# Patient Record
Sex: Female | Born: 1985 | Race: Black or African American | Hispanic: No | Marital: Single | Smoking: Former smoker
Health system: Southern US, Community
[De-identification: ages and names within clinical notes are randomized; demographics above are authoritative.]

## PROBLEM LIST (undated history)

## (undated) DIAGNOSIS — R87620 Atypical squamous cells of undetermined significance on cytologic smear of vagina (ASC-US): Secondary | ICD-10-CM

## (undated) DIAGNOSIS — Z789 Other specified health status: Secondary | ICD-10-CM

## (undated) HISTORY — DX: Atypical squamous cells of undetermined significance on cytologic smear of vagina (ASC-US): R87.620

---

## 2007-07-21 ENCOUNTER — Inpatient Hospital Stay: Payer: Self-pay

## 2010-01-11 ENCOUNTER — Ambulatory Visit: Payer: Self-pay | Admitting: Family Medicine

## 2010-08-23 ENCOUNTER — Inpatient Hospital Stay: Payer: Self-pay | Admitting: Obstetrics and Gynecology

## 2010-12-11 ENCOUNTER — Emergency Department: Payer: Self-pay | Admitting: Unknown Physician Specialty

## 2014-10-13 ENCOUNTER — Emergency Department: Payer: Self-pay | Admitting: Emergency Medicine

## 2014-10-14 LAB — URINALYSIS, COMPLETE
BACTERIA: NONE SEEN
BILIRUBIN, UR: NEGATIVE
Blood: NEGATIVE
Glucose,UR: NEGATIVE mg/dL (ref 0–75)
LEUKOCYTE ESTERASE: NEGATIVE
Nitrite: NEGATIVE
Ph: 5 (ref 4.5–8.0)
Protein: NEGATIVE
RBC,UR: 1 /HPF (ref 0–5)
Specific Gravity: 1.027 (ref 1.003–1.030)
WBC UR: 4 /HPF (ref 0–5)

## 2015-03-11 ENCOUNTER — Inpatient Hospital Stay
Admit: 2015-03-11 | Disposition: A | Discharge status: Home / Self Care | Payer: Self-pay | Attending: Obstetrics and Gynecology | Admitting: Obstetrics and Gynecology

## 2015-03-12 LAB — CBC WITH DIFFERENTIAL/PLATELET
Basophil #: 0 10*3/uL (ref 0.0–0.1)
Basophil %: 0.2 %
EOS ABS: 0 10*3/uL (ref 0.0–0.7)
Eosinophil %: 0.2 %
HCT: 33.3 % — AB (ref 35.0–47.0)
HGB: 11.2 g/dL — ABNORMAL LOW (ref 12.0–16.0)
Lymphocyte #: 2 10*3/uL (ref 1.0–3.6)
Lymphocyte %: 20.5 %
MCH: 30.4 pg (ref 26.0–34.0)
MCHC: 33.6 g/dL (ref 32.0–36.0)
MCV: 90 fL (ref 80–100)
Monocyte #: 0.6 x10 3/mm (ref 0.2–0.9)
Monocyte %: 6.4 %
NEUTROS ABS: 7 10*3/uL — AB (ref 1.4–6.5)
Neutrophil %: 72.7 %
Platelet: 275 10*3/uL (ref 150–440)
RBC: 3.69 10*6/uL — ABNORMAL LOW (ref 3.80–5.20)
RDW: 14 % (ref 11.5–14.5)
WBC: 9.6 10*3/uL (ref 3.6–11.0)

## 2015-03-13 LAB — HEMATOCRIT: HCT: 33.9 % — AB (ref 35.0–47.0)

## 2015-03-30 NOTE — H&P (Signed)
L&D Evaluation:  History Expanded:  HPI 29 yo G3P2002 at 441w6d gestational age presents with regular uterine contractions.  She notes positive fetal movement, no leakage of fluid, no vaginal bleeding.  Patient initially received care at [facility] and transferred at about 19 weeks.  Pregnancy uncomplicated.   Gravida 3   Term 2   PreTerm 0   Abortion 0   Living 2   Blood Type (Maternal) B positive   Group B Strep Results Maternal (Result >5wks must be treated as unknown) negative   Maternal HIV Negative   Maternal Syphilis Ab Nonreactive   Maternal Varicella Non-Immune   Rubella Results (Maternal) immune   EDC 13-Mar-2015   Patient's Medical History No Chronic Illness   Patient's Surgical History none   Medications Pre Natal Vitamins   Allergies NKDA   Social History history of tobacco use   Family History Non-Contributory   ROS:  ROS All systems were reviewed.  HEENT, CNS, GI, GU, Respiratory, CV, Renal and Musculoskeletal systems were found to be normal., unless noted in HPI   Exam:  Vital Signs AFVSS   General no apparent distress   Mental Status clear   Chest clear   Heart normal sinus rhythm   Abdomen gravid, tender with contractions   Estimated Fetal Weight 7.5 pounds   Fetal Position ceph   Back no CVAT   Edema no edema   Pelvic 7cm per RN   Mebranes Intact   FHT normal rate with no decels   FHT Description 145/mod var/+accels/no decels   Ucx regular   Skin no lesions   Impression:  Impression 1) Intrauterine pregnancy at [redacted]w[redacted]d gestational age, 2) active labor   Plan:  Comments 1) Labor: expectant management 2) Fetus - category I tracing 3) PNL B positive / ABSC negative / RI / VZNI -vaccinate pp / HIV neg / RPR NR / HBsAg neg / 1-hr OGTT 95 / GBS negative /  4) TDAP 01/08/15, flu  declined 5) Disposition - home postpartum   Labs:  Lab Results: Routine Hem:  22-Apr-16 00:15   WBC (CBC) 9.6  Hematocrit (CBC)  33.3   Platelet Count (CBC) 275   Electronic Signatures: Conard NovakJackson, Mohsin Crum D (MD)  (Signed 22-Apr-16 02:26)  Authored: L&D Evaluation, Labs   Last Updated: 22-Apr-16 02:26 by Conard NovakJackson, Jasson Siegmann D (MD)

## 2015-07-15 ENCOUNTER — Emergency Department
Admission: EM | Admit: 2015-07-15 | Discharge: 2015-07-15 | Disposition: A | Discharge status: Home / Self Care | Payer: No Typology Code available for payment source | Attending: Emergency Medicine | Admitting: Emergency Medicine

## 2015-07-15 ENCOUNTER — Encounter: Payer: Self-pay | Admitting: Emergency Medicine

## 2015-07-15 DIAGNOSIS — Y9389 Activity, other specified: Secondary | ICD-10-CM | POA: Diagnosis not present

## 2015-07-15 DIAGNOSIS — M25511 Pain in right shoulder: Secondary | ICD-10-CM

## 2015-07-15 DIAGNOSIS — Z3A00 Weeks of gestation of pregnancy not specified: Secondary | ICD-10-CM | POA: Insufficient documentation

## 2015-07-15 DIAGNOSIS — S4991XA Unspecified injury of right shoulder and upper arm, initial encounter: Secondary | ICD-10-CM | POA: Insufficient documentation

## 2015-07-15 DIAGNOSIS — Y998 Other external cause status: Secondary | ICD-10-CM | POA: Diagnosis not present

## 2015-07-15 DIAGNOSIS — Z3490 Encounter for supervision of normal pregnancy, unspecified, unspecified trimester: Secondary | ICD-10-CM

## 2015-07-15 DIAGNOSIS — O9A219 Injury, poisoning and certain other consequences of external causes complicating pregnancy, unspecified trimester: Secondary | ICD-10-CM | POA: Insufficient documentation

## 2015-07-15 DIAGNOSIS — O99330 Smoking (tobacco) complicating pregnancy, unspecified trimester: Secondary | ICD-10-CM | POA: Insufficient documentation

## 2015-07-15 DIAGNOSIS — F17200 Nicotine dependence, unspecified, uncomplicated: Secondary | ICD-10-CM | POA: Diagnosis not present

## 2015-07-15 DIAGNOSIS — Y92410 Unspecified street and highway as the place of occurrence of the external cause: Secondary | ICD-10-CM | POA: Insufficient documentation

## 2015-07-15 LAB — POCT PREGNANCY, URINE: Preg Test, Ur: POSITIVE — AB
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
## 2015-10-04 ENCOUNTER — Other Ambulatory Visit: Payer: Self-pay | Admitting: Obstetrics and Gynecology

## 2015-10-04 DIAGNOSIS — G930 Cerebral cysts: Secondary | ICD-10-CM

## 2015-10-21 ENCOUNTER — Ambulatory Visit
Admission: RE | Admit: 2015-10-21 | Discharge: 2015-10-21 | Disposition: A | Discharge status: Home / Self Care | Payer: No Typology Code available for payment source | Source: Ambulatory Visit | Attending: Obstetrics and Gynecology | Admitting: Obstetrics and Gynecology

## 2015-10-21 ENCOUNTER — Ambulatory Visit (HOSPITAL_BASED_OUTPATIENT_CLINIC_OR_DEPARTMENT_OTHER)
Admission: RE | Admit: 2015-10-21 | Discharge: 2015-10-21 | Disposition: A | Discharge status: Home / Self Care | Payer: No Typology Code available for payment source | Source: Ambulatory Visit | Attending: Obstetrics and Gynecology | Admitting: Obstetrics and Gynecology

## 2015-10-21 DIAGNOSIS — O358XX0 Maternal care for other (suspected) fetal abnormality and damage, not applicable or unspecified: Secondary | ICD-10-CM | POA: Diagnosis not present

## 2015-10-21 DIAGNOSIS — G930 Cerebral cysts: Secondary | ICD-10-CM

## 2015-10-21 DIAGNOSIS — O350XX0 Maternal care for (suspected) central nervous system malformation in fetus, not applicable or unspecified: Secondary | ICD-10-CM | POA: Insufficient documentation

## 2015-10-21 DIAGNOSIS — O3503X0 Maternal care for (suspected) central nervous system malformation or damage in fetus, choroid plexus cysts, not applicable or unspecified: Secondary | ICD-10-CM | POA: Insufficient documentation

## 2015-10-21 NOTE — Progress Notes (Signed)
Deborah Wells, MS, CGC performed an integral service incident to the physician's initial service.  I was physically present in the clinical area and was immediately available to render assistance.   Furman Trentman C Kalev Temme  

## 2015-10-21 NOTE — Progress Notes (Signed)
Joann Clark Length of Consultation: 30 minute consultation  Joann Clark was referred to [facility] for ultrasound and genetic counseling after a choroid plexus cyst was noted on a routine ultrasound.   A thorough evaluation of the fetal anatomy was performed in our clinic, and within the limits of the procedure, all fetal anatomy appeared normal.  The choroid plexus cysts were not seen today.  The choroid plexus is an area in the brain where cerebral spinal fluid, the fluid that bathes the brain and spinal cord, is made. Cysts, or fluid filled sacs, are sometimes found in the choroid plexus of babies both before and after they are born. These cysts are referred to as choroid plexus cysts (CPC).  Some literature suggests that as many as 1 in 50 fetuses (2%) evaluated by ultrasound will show these cysts. The significance of these cysts remains unclear, although it is believed that most are a normal variation of development. Many studies have been done to see if choroid plexus cysts seen on prenatal ultrasound increase the concern for other problems in the baby.  After evaluating these studies, it appears that there may be an increase in the risk for a chromosome condition called trisomy 49.  However, this increase in risk is more significant if other ultrasound markers of chromosome conditions are seen, or if a birth defect is identified by ultrasound. The increase in risk is less significant if the choroid plexus cysts are an "isolated" finding at ultrasound.  In the absence of a chromosome condition, choroid plexus cysts are not expected to be harmful to the fetus.    Chromosomes are the inherited structures that contain our genes (traits).  Each cell of our body normally has 46 chromosomes, matched up in 23 pairs.  The last pair determines our gender and are called the sex chromosomes.  A female has an X and a Y chromosome, while a female has two X chromosomes.  Rarely, when a  mother's egg and father's sperm unite, an extra or missing chromosome can be passed on to the baby by mistake.  We discussed examples of such a problem including: Down syndrome (an extra 21) and Trisomy 18 syndrome (an extra 18), both involving some degree of mental retardation and physical problems, with the later being much more severe.  The finding of isolated choroid plexus cysts in this pregnancy may increase the chance for trisomy 18 slightly, but it is expected that the risk is still less than the 1 in 200 chance for complications from amniocentesis.  The most likely outcome is that the baby is in good health.  We discussed the following prenatal testing options for this pregnancy:  Maternal serum marker screening is a maternal blood test which measures up to four pregnancy proteins.  Currently, this screening test can detect approximately 80% of cases of Down syndrome, 60% of Trisomy 18 and >80% of neural tube defects (e.g. spina bifida).  Because it does not directly examine the fetal chromosomes or fetus itself, it cannot positively diagnose or rule out these problems. This was previously ordered through [facility] and was normal.  Targeted ultrasound uses high frequency sound waves to create an image of the developing fetus.  An ultrasound is often recommended as a routine means of evaluating the pregnancy.  It is also used to screen for fetal anatomy problems (for example, a heart defect) that might be suggestive of a chromosomal or other abnormality.   Amniocentesis involves the removal of  a small amount of amniotic fluid from the sac surrounding the fetus with the use of a thin needle inserted through the maternal abdomen and uterus.  Ultrasound guidance is used throughout the procedure.  Fetal cells from amniotic fluid are directly evaluated and > 99.5% of chromosome problems and > 98% of open neural tube defects can be detected. This procedure is generally performed after the 15th week of  pregnancy.  The main risks to this procedure include complications leading to miscarriage in less than 1 in 200 cases (0.5%).  We also reviewed the availability of cell free fetal DNA testing from maternal blood to determine whether or not the baby may have Down syndrome, trisomy 6613, or trisomy 4118.  This test utilizes a maternal blood sample and DNA sequencing technology to isolate circulating cell free fetal DNA from maternal plasma.  The fetal DNA can then be analyzed for DNA sequences that are derived from the three most common chromosomes involved in aneuploidy, chromosomes 13, 18, and 21.  If the overall amount of DNA is greater than the expected level for any of these chromosomes, aneuploidy is suspected.  While we do not consider it a replacement for invasive testing and karyotype analysis, a negative result from this testing would be reassuring, though not a guarantee of a normal chromosome complement for the baby.  An abnormal result is certainly suggestive of an abnormal chromosome complement, though we would still recommend CVS or amniocentesis to confirm any findings from this testing.  This testing was also performed previously and was normal, showing no indication of trisomy 13, 18 or 21.  The sex chromosome analysis was also normal, though the patient does not wish to know the gender of the baby.  A detailed pregnancy and family history was obtained.  Joann Clark reported that this is her fourth pregnancy, the second with her current partner.  She has three healthy daughters.  In the family history, she stated that she has two brothers with bipolar disorder and one maternal great uncle also with mental health concerns.  She denied a personal history of mental health conditions. We discussed that the genetic factors involved in the development of mental health conditions are not well understood, though in some families there appears to be a strong genetic component.  We encouraged her to be aware of  this history and possible increased risks for herself and her children, but that no speicfic testing is available.  She also reported a first cousin once removed with Down syndrome.  Down syndrome is caused by an extra copy of the genetic instructions located on chromosome number 21.  These extra instructions result in the characteristic facial appearance, mental retardation, and an increased risk for some types of birth defects.  The majority (95%) of persons with Down syndrome have three freestanding copies of chromosome number 21, called trisomy 9521.  This type of Down syndrome occurs as a sporadic condition and does not increase the chance for other family members to have Down syndrome.  Rarely, Down syndrome is caused by a rearrangement of the genetic instructions, where the extra chromosome number 21 is attached to another chromosome.  This type of chromosome rearrangement can be passed down through families and therefore may increase the chance for Down syndrome in other family members.  We cannot determine which type of Down syndrome is present without documentation of chromosome studies in the affected family member.  If any additional information is obtained about this history, please do not hesitate  to contact us.  The screening performed in this pregnancy showed no increased risk for Down syndrome.  After consideration of the options, the patient elected to have an ultrasound only today.  She declines amniocentesis given the normal ultrasound today as well as normal results of her maternal serum screening and InformaSeq testing.  Thank you for involving [place] in the care of this patient.  Joann Clark was encouraged to call with questions or concerns. We can be reached at [phone].  Cherly Anderson, MS, CGC

## 2015-11-21 NOTE — L&D Delivery Note (Signed)
Delivery Note At 5:29 PM a viable female was delivered via  (Presentation: OA/ROA/OP).  APGAR: 8, 9; weight: 3090g  .   Placenta status: Intact, Spontaneous.  Cord:  with the following complications: None.  Cord pH: NA  Pt came from ER with urge to push. 8.5cm/90/-2. Baby with decelerations corresponding with/just after contractions down to 80's/90's with return over 30 seconds to baseline 140's. Attempt to reduce lip but pushes were ineffective. AROM for clear with some fetal descent to -1. FHR improved following AROM and had pt breathe through a couple of ctxs. Increased pressure and better focus so mom pushed to delivery in 2 ctxs viable female infant.  The head followed by shoulders, which delivered without difficulty, and the rest of the body.  Tight nuchal cord noted reduced following birth of baby.  Baby to mom's chest.  Cord clamped and cut after > 1 min delay.  No cord blood obtained.  Placenta delivered spontaneously, intact, with a 3-vessel cord. Additional membranes pulled out with ring forceps. Perineum intact.  All counts correct.  Hemostasis obtained with IV pitocin and fundal massage. EBL 150 mL.    Anesthesia: None  Episiotomy: none Lacerations: none  Suture Repair: NA Est. Blood Loss (mL): 150  Mom to postpartum.  Baby to Couplet care / Skin to Skin.  Tresea MallGLEDHILL,Cristy Colmenares, CNM  This patient and plan were discussed with Dr Jean RosenthalJackson 02/23/2016

## 2016-02-23 ENCOUNTER — Inpatient Hospital Stay
Admission: EM | Admit: 2016-02-23 | Discharge: 2016-02-24 | DRG: 775 | Disposition: A | Discharge status: Home / Self Care | Payer: Medicaid Other | Attending: Obstetrics and Gynecology | Admitting: Obstetrics and Gynecology

## 2016-02-23 ENCOUNTER — Encounter: Payer: Self-pay | Admitting: *Deleted

## 2016-02-23 DIAGNOSIS — Z87891 Personal history of nicotine dependence: Secondary | ICD-10-CM

## 2016-02-23 DIAGNOSIS — Z3A40 40 weeks gestation of pregnancy: Secondary | ICD-10-CM

## 2016-02-23 DIAGNOSIS — Z8249 Family history of ischemic heart disease and other diseases of the circulatory system: Secondary | ICD-10-CM

## 2016-02-23 HISTORY — DX: Other specified health status: Z78.9

## 2016-02-23 LAB — TYPE AND SCREEN
ABO/RH(D): B POS
ANTIBODY SCREEN: NEGATIVE

## 2016-02-23 LAB — ABO/RH: ABO/RH(D): B POS

## 2016-02-23 MED ORDER — LACTATED RINGERS IV SOLN: 500.0000 mL | INTRAVENOUS | Status: DC | PRN

## 2016-02-23 MED ORDER — LACTATED RINGERS IV SOLN
INTRAVENOUS | Status: DC
  Administered 2016-02-23: 17:00:00 via INTRAVENOUS

## 2016-02-23 MED ORDER — ACETAMINOPHEN 325 MG PO TABS: 650.0000 mg | ORAL_TABLET | ORAL | Status: DC | PRN

## 2016-02-23 MED ORDER — TETANUS-DIPHTH-ACELL PERTUSSIS 5-2.5-18.5 LF-MCG/0.5 IM SUSP: 0.5000 mL | Freq: Once | INTRAMUSCULAR | Status: DC

## 2016-02-23 MED ORDER — AMMONIA AROMATIC IN INHA
RESPIRATORY_TRACT | Status: AC
  Filled 2016-02-23: qty 10

## 2016-02-23 MED ORDER — OXYTOCIN BOLUS FROM INFUSION: 500.0000 mL | INTRAVENOUS | Status: DC

## 2016-02-23 MED ORDER — DIPHENHYDRAMINE HCL 25 MG PO CAPS: 25.0000 mg | ORAL_CAPSULE | Freq: Four times a day (QID) | ORAL | Status: DC | PRN

## 2016-02-23 MED ORDER — IBUPROFEN 600 MG PO TABS
600.0000 mg | ORAL_TABLET | Freq: Four times a day (QID) | ORAL | Status: DC
  Administered 2016-02-24 (×3): 600 mg via ORAL
  Filled 2016-02-23 (×3): qty 1

## 2016-02-23 MED ORDER — OXYCODONE-ACETAMINOPHEN 5-325 MG PO TABS
2.0000 | ORAL_TABLET | ORAL | Status: DC | PRN
  Administered 2016-02-23: 1 via ORAL
  Filled 2016-02-23: qty 1

## 2016-02-23 MED ORDER — SENNOSIDES-DOCUSATE SODIUM 8.6-50 MG PO TABS: 2.0000 | ORAL_TABLET | ORAL | Status: DC

## 2016-02-23 MED ORDER — OXYCODONE HCL 5 MG PO TABS
10.0000 mg | ORAL_TABLET | ORAL | Status: DC | PRN
  Administered 2016-02-23: 10 mg via ORAL
  Filled 2016-02-23: qty 2

## 2016-02-23 MED ORDER — LANOLIN HYDROUS EX OINT: TOPICAL_OINTMENT | CUTANEOUS | Status: DC | PRN

## 2016-02-23 MED ORDER — BENZOCAINE-MENTHOL 20-0.5 % EX AERO
1.0000 "application " | INHALATION_SPRAY | CUTANEOUS | Status: DC | PRN
  Administered 2016-02-23: 1 via TOPICAL
  Filled 2016-02-23: qty 56

## 2016-02-23 MED ORDER — OXYTOCIN 10 UNIT/ML IJ SOLN
INTRAMUSCULAR | Status: AC
  Filled 2016-02-23: qty 2

## 2016-02-23 MED ORDER — OXYCODONE-ACETAMINOPHEN 5-325 MG PO TABS: 1.0000 | ORAL_TABLET | ORAL | Status: DC | PRN

## 2016-02-23 MED ORDER — OXYTOCIN 40 UNITS IN LACTATED RINGERS INFUSION - SIMPLE MED
2.5000 [IU]/h | INTRAVENOUS | Status: DC
  Administered 2016-02-23: 39.96 [IU]/h via INTRAVENOUS
  Filled 2016-02-23: qty 1000

## 2016-02-23 MED ORDER — WITCH HAZEL-GLYCERIN EX PADS: 1.0000 "application " | MEDICATED_PAD | CUTANEOUS | Status: DC | PRN

## 2016-02-23 MED ORDER — IBUPROFEN 600 MG PO TABS
600.0000 mg | ORAL_TABLET | Freq: Four times a day (QID) | ORAL | Status: DC
  Administered 2016-02-23: 600 mg via ORAL
  Filled 2016-02-23: qty 1

## 2016-02-23 MED ORDER — ONDANSETRON HCL 4 MG/2ML IJ SOLN: 4.0000 mg | Freq: Four times a day (QID) | INTRAMUSCULAR | Status: DC | PRN

## 2016-02-23 MED ORDER — LIDOCAINE HCL (PF) 1 % IJ SOLN: 30.0000 mL | INTRAMUSCULAR | Status: DC | PRN

## 2016-02-23 MED ORDER — LIDOCAINE HCL (PF) 1 % IJ SOLN
INTRAMUSCULAR | Status: AC
  Filled 2016-02-23: qty 30

## 2016-02-23 MED ORDER — SIMETHICONE 80 MG PO CHEW: 80.0000 mg | CHEWABLE_TABLET | ORAL | Status: DC | PRN

## 2016-02-23 MED ORDER — MISOPROSTOL 200 MCG PO TABS
ORAL_TABLET | ORAL | Status: AC
  Filled 2016-02-23: qty 4

## 2016-02-23 MED ORDER — BUTORPHANOL TARTRATE 1 MG/ML IJ SOLN: 1.0000 mg | INTRAMUSCULAR | Status: DC | PRN

## 2016-02-23 MED ORDER — OXYCODONE HCL 5 MG PO TABS: 5.0000 mg | ORAL_TABLET | ORAL | Status: DC | PRN

## 2016-02-23 MED ORDER — PRENATAL MULTIVITAMIN CH
1.0000 | ORAL_TABLET | Freq: Every day | ORAL | Status: DC
  Administered 2016-02-24: 1 via ORAL
  Filled 2016-02-23: qty 1

## 2016-02-23 MED ORDER — ONDANSETRON HCL 4 MG PO TABS: 4.0000 mg | ORAL_TABLET | ORAL | Status: DC | PRN

## 2016-02-23 MED ORDER — DIBUCAINE 1 % RE OINT: 1.0000 "application " | TOPICAL_OINTMENT | RECTAL | Status: DC | PRN

## 2016-02-23 MED ORDER — ONDANSETRON HCL 4 MG/2ML IJ SOLN: 4.0000 mg | INTRAMUSCULAR | Status: DC | PRN

## 2016-02-23 NOTE — H&P (Signed)
OB History & Physical   History of Present Illness:  Chief Complaint: Contractions, urge to push  HPI:  Joann Clark is a 30 y.o. 634P2003 female at 218w2d dated by LMP.  Her pregnancy has been uncomplicated.    She reports contractions.   She denies leakage of fluid.   She denies vaginal bleeding.   She reports fetal movement.    Maternal Medical History:   Past Medical History  Diagnosis Date  . Medical history non-contributory     History reviewed. No pertinent past surgical history.  No Known Allergies  Prior to Admission medications   Not on File    OB History  Gravida Para Term Preterm AB SAB TAB Ectopic Multiple Living  4 2 2       3     # Outcome Date GA Lbr Len/2nd Weight Sex Delivery Anes PTL Lv  4 Current           3 Gravida 03/21/15    F Vag-Spont   Y  2 Term 11/20/10    F Vag-Spont   Y  1 Term 11/25/03    F Jarrett AblesVag-Spont  N Y      Prenatal care site: [facility]  Social History: She  reports that she has quit smoking. She has never used smokeless tobacco. She reports that she does not drink alcohol or use illicit drugs.  Family History: family history includes Hypertension in her mother.   Review of Systems: Negative x 10 systems reviewed except as noted in the HPI.    Physical Exam:  Vital Signs: LMP 05/17/2015 (Exact Date) General: no acute distress.  HEENT: normocephalic, atraumatic Heart: regular rate & rhythm.  No murmurs/rubs/gallops Lungs: clear to auscultation bilaterally Abdomen: soft, gravid, non-tender;  EFW: 6 1/2 pounds Pelvic:   External: Normal external female genitalia  Cervix:  8 /  90 /  -2    Extremities: non-tender, symmetric, no edema bilaterally.  DTRs: 2+ bilaterally  Neurologic: Alert & oriented x 3.    Pertinent Results:  Prenatal Labs: Blood type/Rh B positive  Antibody screen negative  Rubella Immune  Varicella Not immune    RPR negative  HBsAg negative  HIV negative  GC negative  Chlamydia negative  Genetic  screening negative  1 hour GTT 88  3 hour GTT NA  GBS negative on 3/20   Baseline FHR: 145 beats/min   Variability: moderate   Accelerations: present   Decelerations: present to 80's/90's just after contraction Contractions: present frequency: every 2-3 minutes Overall assessment: Cat II    Assessment:  Joann Clark is a 30 y.o. 694P2003 female at 8518w2d with active labor  Plan:  1. Admit to Labor & Delivery  2. CBC, T&S, Clrs, IVF 3. GBS negative.   4. Fetal well-being: Cat II 5. AROM/Reduce anterior lip for delivery  Giavanni Odonovan, CNM  This patient and plan were discussed with Dr Jean RosenthalJackson 02/23/2016

## 2016-02-23 NOTE — Discharge Summary (Signed)
OB Discharge Summary  Patient Name: Joann Clark DOB: 09/07/1986 MRN: [id number]  Date of admission: 02/23/2016 Delivering MD: Tresea Mall, CNM Date of Delivery: 02/23/2016  Date of discharge: 02/24/16 Admitting diagnosis: 40 wks contractions, active labor Intrauterine pregnancy: [redacted]w[redacted]d     Secondary diagnosis: None     Discharge diagnosis: Term Pregnancy Delivered                                                                                                Post partum procedures:none  Augmentation: AROM  Complications: None  Hospital course:  Onset of Labor With Vaginal Delivery     30 y.o. yo G4P2003 at [redacted]w[redacted]d was admitted in Active Labor on 02/23/2016. Patient had an uncomplicated labor course as follows:  Membrane Rupture Time/Date: 5:09 PM ,02/23/2016   Intrapartum Procedures: Episiotomy:   none                                        Lacerations:    none Patient had a delivery of a Viable infant. 02/23/2016  Information for the patient's newborn:  Sarely, Stracener Girl Tanika [[id number]]       Pateint had an uncomplicated postpartum course.  She is ambulating, tolerating a regular diet, passing flatus, and urinating well. Patient is discharged home in stable condition on 02/24/16.    Physical exam  Filed Vitals:   02/24/16 0434 02/24/16 0700  BP: 104/53 97/58  Pulse: 61 62  Temp: 98.6 F (37 C) 98 F (36.7 C)  Resp: 18 20   General: alert, cooperative and no distress Lochia: appropriate Uterine Fundus: firm Incision: N/A DVT Evaluation: No evidence of DVT seen on physical exam.  Labs:  Postpartum HCT: 31.2 - precipitous delivery, so not pre-delivery labs obtained  Discharge instruction:  Call office if you have any of the following: headache, visual changes, fever >100 F, chills, breast concerns, excessive vaginal bleeding, incision drainage or problems, leg pain or redness, depression or any other concerns.   Activity: Do not lift > 10 lbs for 6 weeks.  No intercourse or  tampons for 6 weeks.  No driving for 1-2 weeks.     Medications:    Medication List    TAKE these medications        ibuprofen 600 MG tablet  Commonly known as:  ADVIL,MOTRIN  Take 1 tablet (600 mg total) by mouth every 6 (six) hours.         Diet: routine diet  Activity: Advance as tolerated. Pelvic rest for 6 weeks.   Outpatient follow up: Follow-up Information    Follow up with [facility], CNM. Schedule an appointment as soon as possible for a visit in 6 weeks.   Specialty:  Obstetrics   Why:  postpartum visit & nexplanon insertion   Contact information:   [address] [place] [address] [phone]       Postpartum contraception: Nexplanon Rhogam Given postpartum: NA Rubella vaccine given postpartum: yes Varicella vaccine given postpartum: pt declined TDaP given antepartum or postpartum:  antepartum   Newborn Data: Live born female  Birth Weight: 3090g APGAR: 8, 9   Baby Feeding: Bottle  Disposition:home with mother

## 2016-02-24 LAB — CBC
HCT: 31.2 % — ABNORMAL LOW (ref 35.0–47.0)
Hemoglobin: 10.8 g/dL — ABNORMAL LOW (ref 12.0–16.0)
MCH: 31 pg (ref 26.0–34.0)
MCHC: 34.5 g/dL (ref 32.0–36.0)
MCV: 89.7 fL (ref 80.0–100.0)
PLATELETS: 262 10*3/uL (ref 150–440)
RBC: 3.48 MIL/uL — ABNORMAL LOW (ref 3.80–5.20)
RDW: 14.3 % (ref 11.5–14.5)
WBC: 9.8 10*3/uL (ref 3.6–11.0)

## 2016-02-24 MED ORDER — VARICELLA VIRUS VACCINE LIVE 1350 PFU/0.5ML IJ SUSR
0.5000 mL | Freq: Once | INTRAMUSCULAR | Status: DC
  Filled 2016-02-24: qty 0.5

## 2016-02-24 MED ORDER — IBUPROFEN 600 MG PO TABS: 600.0000 mg | ORAL_TABLET | Freq: Four times a day (QID) | ORAL | Status: DC

## 2016-02-24 NOTE — Progress Notes (Signed)
Patient discharged home with infant and family. Discharge instructions, prescriptions and follow up appointment given to and reviewed with patient and family. Patient verbalized understanding.

## 2016-02-24 NOTE — Discharge Instructions (Signed)
Discharge instructions:  ° °Call office if you have any of the following: headache, visual changes, fever >100 F, chills, breast concerns, excessive vaginal bleeding, incision drainage or problems, leg pain or redness, depression or any other concerns.  ° °Activity: Do not lift > 10 lbs for 6 weeks.  °No intercourse or tampons for 6 weeks.  °No driving for 1-2 weeks.  ° °

## 2016-02-25 LAB — RPR: RPR: NONREACTIVE

## 2017-03-09 ENCOUNTER — Ambulatory Visit: Payer: Self-pay

## 2017-03-13 ENCOUNTER — Ambulatory Visit: Payer: Self-pay | Admitting: Obstetrics & Gynecology

## 2017-03-13 ENCOUNTER — Ambulatory Visit (INDEPENDENT_AMBULATORY_CARE_PROVIDER_SITE_OTHER): Payer: Medicaid Other

## 2017-03-13 ENCOUNTER — Ambulatory Visit: Payer: Self-pay

## 2017-03-13 DIAGNOSIS — Z3042 Encounter for surveillance of injectable contraceptive: Secondary | ICD-10-CM

## 2017-03-13 DIAGNOSIS — Z308 Encounter for other contraceptive management: Secondary | ICD-10-CM

## 2017-03-13 MED ORDER — MEDROXYPROGESTERONE ACETATE 150 MG/ML IM SUSP
150.0000 mg | Freq: Once | INTRAMUSCULAR | Status: AC
  Administered 2017-03-13: 150 mg via INTRAMUSCULAR

## 2017-03-13 NOTE — Progress Notes (Signed)
Pt here for depo which was given IM right glut.  NDC# [address]-1 

## 2017-03-26 ENCOUNTER — Encounter: Payer: Self-pay | Admitting: Obstetrics & Gynecology

## 2017-03-26 ENCOUNTER — Ambulatory Visit (INDEPENDENT_AMBULATORY_CARE_PROVIDER_SITE_OTHER): Payer: Medicaid Other | Admitting: Obstetrics & Gynecology

## 2017-03-26 VITALS — BP 100/60 | Ht 65.0 in | Wt 185.0 lb

## 2017-03-26 DIAGNOSIS — Z308 Encounter for other contraceptive management: Secondary | ICD-10-CM

## 2017-03-26 DIAGNOSIS — Z0000 Encounter for general adult medical examination without abnormal findings: Secondary | ICD-10-CM

## 2017-03-26 DIAGNOSIS — Z131 Encounter for screening for diabetes mellitus: Secondary | ICD-10-CM

## 2017-03-26 DIAGNOSIS — Z13220 Encounter for screening for lipoid disorders: Secondary | ICD-10-CM

## 2017-03-26 DIAGNOSIS — Z124 Encounter for screening for malignant neoplasm of cervix: Secondary | ICD-10-CM

## 2017-03-26 DIAGNOSIS — Z1321 Encounter for screening for nutritional disorder: Secondary | ICD-10-CM

## 2017-03-26 DIAGNOSIS — Z1329 Encounter for screening for other suspected endocrine disorder: Secondary | ICD-10-CM

## 2017-03-26 MED ORDER — MEDROXYPROGESTERONE ACETATE 150 MG/ML IM SUSP: INTRAMUSCULAR | 3 refills | Status: DC

## 2017-03-26 NOTE — Patient Instructions (Signed)
Health Maintenance, Female Adopting a healthy lifestyle and getting preventive care can go a long way to promote health and wellness. Talk with your health care provider about what schedule of regular examinations is right for you. This is a good chance for you to check in with your provider about disease prevention and staying healthy. In between checkups, there are plenty of things you can do on your own. Experts have done a lot of research about which lifestyle changes and preventive measures are most likely to keep you healthy. Ask your health care provider for more information. Weight and diet Eat a healthy diet  Be sure to include plenty of vegetables, fruits, low-fat dairy products, and lean protein.  Do not eat a lot of foods high in solid fats, added sugars, or salt.  Get regular exercise. This is one of the most important things you can do for your health.  Most adults should exercise for at least 150 minutes each week. The exercise should increase your heart rate and make you sweat (moderate-intensity exercise).  Most adults should also do strengthening exercises at least twice a week. This is in addition to the moderate-intensity exercise. Maintain a healthy weight  Body mass index (BMI) is a measurement that can be used to identify possible weight problems. It estimates body fat based on height and weight. Your health care provider can help determine your BMI and help you achieve or maintain a healthy weight.  For females 40 years of age and older:  A BMI below 18.5 is considered underweight.  A BMI of 18.5 to 24.9 is normal.  A BMI of 25 to 29.9 is considered overweight.  A BMI of 30 and above is considered obese. Watch levels of cholesterol and blood lipids  You should start having your blood tested for lipids and cholesterol at 31 years of age, then have this test every 5 years.  You may need to have your cholesterol levels checked more often if:  Your lipid or  cholesterol levels are high.  You are older than 31 years of age.  You are at high risk for heart disease. Cancer screening Lung Cancer  Lung cancer screening is recommended for adults 31-79 years old who are at high risk for lung cancer because of a history of smoking.  A yearly low-dose CT scan of the lungs is recommended for people who:  Currently smoke.  Have quit within the past 15 years.  Have at least a 30-pack-year history of smoking. A pack year is smoking an average of one pack of cigarettes a day for 1 year.  Yearly screening should continue until it has been 15 years since you quit.  Yearly screening should stop if you develop a health problem that would prevent you from having lung cancer treatment. Breast Cancer  Practice breast self-awareness. This means understanding how your breasts normally appear and feel.  It also means doing regular breast self-exams. Let your health care provider know about any changes, no matter how small.  If you are in your 31s or 30s, you should have a clinical breast exam (CBE) by a health care provider every 1-3 years as part of a regular health exam.  If you are 75 or older, have a CBE every year. Also consider having a breast X-ray (mammogram) every year.  If you have a family history of breast cancer, talk to your health care provider about genetic screening.  If you are at high risk for breast cancer, talk  to your health care provider about having an MRI and a mammogram every year.  Breast cancer gene (BRCA) assessment is recommended for women who have family members with BRCA-related cancers. BRCA-related cancers include:  Breast.  Ovarian.  Tubal.  Peritoneal cancers.  Results of the assessment will determine the need for genetic counseling and BRCA1 and BRCA2 testing. Cervical Cancer  Your health care provider may recommend that you be screened regularly for cancer of the pelvic organs (ovaries, uterus, and vagina).  This screening involves a pelvic examination, including checking for microscopic changes to the surface of your cervix (Pap test). You may be encouraged to have this screening done every 3 years, beginning at age 31.  For women ages 66-65, health care providers may recommend pelvic exams and Pap testing every 3 years, or they may recommend the Pap and pelvic exam, combined with testing for human papilloma virus (HPV), every 5 years. Some types of HPV increase your risk of cervical cancer. Testing for HPV may also be done on women of any age with unclear Pap test results.  Other health care providers may not recommend any screening for nonpregnant women who are considered low risk for pelvic cancer and who do not have symptoms. Ask your health care provider if a screening pelvic exam is right for you.  If you have had past treatment for cervical cancer or a condition that could lead to cancer, you need Pap tests and screening for cancer for at least 20 years after your treatment. If Pap tests have been discontinued, your risk factors (such as having a new sexual partner) need to be reassessed to determine if screening should resume. Some women have medical problems that increase the chance of getting cervical cancer. In these cases, your health care provider may recommend more frequent screening and Pap tests. Colorectal Cancer  This type of cancer can be detected and often prevented.  Routine colorectal cancer screening usually begins at 31 years of age and continues through 31 years of age.  Your health care provider may recommend screening at an earlier age if you have risk factors for colon cancer.  Your health care provider may also recommend using home test kits to check for hidden blood in the stool.  A small camera at the end of a tube can be used to examine your colon directly (sigmoidoscopy or colonoscopy). This is done to check for the earliest forms of colorectal cancer.  Routine  screening usually begins at age 41.  Direct examination of the colon should be repeated every 5-10 years through 31 years of age. However, you may need to be screened more often if early forms of precancerous polyps or small growths are found. Skin Cancer  Check your skin from head to toe regularly.  Tell your health care provider about any new moles or changes in moles, especially if there is a change in a mole's shape or color.  Also tell your health care provider if you have a mole that is larger than the size of a pencil eraser.  Always use sunscreen. Apply sunscreen liberally and repeatedly throughout the day.  Protect yourself by wearing long sleeves, pants, a wide-brimmed hat, and sunglasses whenever you are outside. Heart disease, diabetes, and high blood pressure  High blood pressure causes heart disease and increases the risk of stroke. High blood pressure is more likely to develop in:  People who have blood pressure in the high end of the normal range (130-139/85-89 mm Hg).  People who are overweight or obese.  People who are African American.  If you are 59-53 years of age, have your blood pressure checked every 3-5 years. If you are 53 years of age or older, have your blood pressure checked every year. You should have your blood pressure measured twice-once when you are at a hospital or clinic, and once when you are not at a hospital or clinic. Record the average of the two measurements. To check your blood pressure when you are not at a hospital or clinic, you can use:  An automated blood pressure machine at a pharmacy.  A home blood pressure monitor.  If you are between 17 years and 38 years old, ask your health care provider if you should take aspirin to prevent strokes.  Have regular diabetes screenings. This involves taking a blood sample to check your fasting blood sugar level.  If you are at a normal weight and have a low risk for diabetes, have this test once  every three years after 31 years of age.  If you are overweight and have a high risk for diabetes, consider being tested at a younger age or more often. Preventing infection Hepatitis B  If you have a higher risk for hepatitis B, you should be screened for this virus. You are considered at high risk for hepatitis B if:  You were born in a country where hepatitis B is common. Ask your health care provider which countries are considered high risk.  Your parents were born in a high-risk country, and you have not been immunized against hepatitis B (hepatitis B vaccine).  You have HIV or AIDS.  You use needles to inject street drugs.  You live with someone who has hepatitis B.  You have had sex with someone who has hepatitis B.  You get hemodialysis treatment.  You take certain medicines for conditions, including cancer, organ transplantation, and autoimmune conditions. Hepatitis C  Blood testing is recommended for:  Everyone born from [birth year] through 1965.  Anyone with known risk factors for hepatitis C. Sexually transmitted infections (STIs)  You should be screened for sexually transmitted infections (STIs) including gonorrhea and chlamydia if:  You are sexually active and are younger than 31 years of age.  You are older than 31 years of age and your health care provider tells you that you are at risk for this type of infection.  Your sexual activity has changed since you were last screened and you are at an increased risk for chlamydia or gonorrhea. Ask your health care provider if you are at risk.  If you do not have HIV, but are at risk, it may be recommended that you take a prescription medicine daily to prevent HIV infection. This is called pre-exposure prophylaxis (PrEP). You are considered at risk if:  You are sexually active and do not regularly use condoms or know the HIV status of your partner(s).  You take drugs by injection.  You are sexually active with a partner  who has HIV. Talk with your health care provider about whether you are at high risk of being infected with HIV. If you choose to begin PrEP, you should first be tested for HIV. You should then be tested every 3 months for as long as you are taking PrEP. Pregnancy  If you are premenopausal and you may become pregnant, ask your health care provider about preconception counseling.  If you may become pregnant, take 400 to 800 micrograms (mcg) of folic acid  every day.  If you want to prevent pregnancy, talk to your health care provider about birth control (contraception). Osteoporosis and menopause  Osteoporosis is a disease in which the bones lose minerals and strength with aging. This can result in serious bone fractures. Your risk for osteoporosis can be identified using a bone density scan.  If you are 21 years of age or older, or if you are at risk for osteoporosis and fractures, ask your health care provider if you should be screened.  Ask your health care provider whether you should take a calcium or vitamin D supplement to lower your risk for osteoporosis.  Menopause may have certain physical symptoms and risks.  Hormone replacement therapy may reduce some of these symptoms and risks. Talk to your health care provider about whether hormone replacement therapy is right for you. Follow these instructions at home:  Schedule regular health, dental, and eye exams.  Stay current with your immunizations.  Do not use any tobacco products including cigarettes, chewing tobacco, or electronic cigarettes.  If you are pregnant, do not drink alcohol.  If you are breastfeeding, limit how much and how often you drink alcohol.  Limit alcohol intake to no more than 1 drink per day for nonpregnant women. One drink equals 12 ounces of beer, 5 ounces of wine, or 1 ounces of hard liquor.  Do not use street drugs.  Do not share needles.  Ask your health care provider for help if you need support  or information about quitting drugs.  Tell your health care provider if you often feel depressed.  Tell your health care provider if you have ever been abused or do not feel safe at home. This information is not intended to replace advice given to you by your health care provider. Make sure you discuss any questions you have with your health care provider. Document Released: 05/22/2011 Document Revised: 04/13/2016 Document Reviewed: 08/10/2015 Elsevier Interactive Patient Education  2017 [employer].

## 2017-03-26 NOTE — Progress Notes (Signed)
   HPI:      Ms. Georgiann Mohsndrea Valin is a 31 y.o. [id number] who LMP was No LMP recorded., she presents today for her annual examination. The patient has no complaints today. The patient is sexually active. Her last pap: was normal. The patient does perform self breast exams.  There is no notable family history of breast or ovarian cancer in her family.  The patient has regular exercise: yes.  The patient denies current symptoms of depression.    GYN History: Contraception: Depo-Provera injections  PMHx: Past Medical History:  Diagnosis Date  . Medical history non-contributory    History reviewed. No pertinent surgical history. Family History  Problem Relation Age of Onset  . Hypertension Mother    Social History  Substance Use Topics  . Smoking status: Former Games developermoker  . Smokeless tobacco: Never Used  . Alcohol use No    Current Outpatient Prescriptions:  .  medroxyPROGESTERone (DEPO-PROVERA) 150 MG/ML injection, INJECT 1 MILLILITER INTO MUSCLE EVERY 3 MONTHS, Disp: 1 mL, Rfl: 3 Allergies: Patient has no known allergies.  ROS  Objective: BP 100/60   Ht 5\' 5"  (1.651 m)   Wt 185 lb (83.9 kg)   BMI 30.79 kg/m   Filed Weights   03/26/17 1016  Weight: 185 lb (83.9 kg)   Body mass index is 30.79 kg/m. OBGyn Exam  Assessment:  ANNUAL EXAM 1. Annual physical exam   2. Screening for cervical cancer   3. Screening for diabetes mellitus   4. Screening for cholesterol level   5. Screening for thyroid disorder   6. Encounter for vitamin deficiency screening      Screening Plan:            1.  Cervical Screening-  Pap smear done today  2. Breast screening- Exam annually and mammogram>40 planned   3. Colonoscopy every 10 years, Hemoccult testing - after age 31  4. Labs To return fasting at a later date  5. Counseling for contraception: Depo-Provera  Other:  1. Annual physical exam - Labs fasting when able - IGP, Aptima HPV - medroxyPROGESTERone (DEPO-PROVERA) 150 MG/ML  injection; INJECT 1 MILLILITER INTO MUSCLE EVERY 3 MONTHS  Dispense: 1 mL; Refill: 3  2. Screening for cervical cancer - IGP, Aptima HPV      F/U  Return in about 1 year (around 03/26/2018) for Annual.  Annamarie MajorPaul Harris, MD, Merlinda FrederickFACOG [facility], [facility] 03/26/2017  10:35 AM

## 2017-03-29 LAB — IGP, APTIMA HPV
HPV Aptima: NEGATIVE
PAP SMEAR COMMENT: 0

## 2017-03-30 ENCOUNTER — Other Ambulatory Visit: Payer: Medicaid Other

## 2017-03-30 DIAGNOSIS — Z13220 Encounter for screening for lipoid disorders: Secondary | ICD-10-CM

## 2017-03-30 DIAGNOSIS — Z1329 Encounter for screening for other suspected endocrine disorder: Secondary | ICD-10-CM

## 2017-03-30 DIAGNOSIS — Z1321 Encounter for screening for nutritional disorder: Secondary | ICD-10-CM

## 2017-03-30 DIAGNOSIS — Z131 Encounter for screening for diabetes mellitus: Secondary | ICD-10-CM

## 2017-03-31 LAB — LIPID PANEL
CHOL/HDL RATIO: 4 ratio (ref 0.0–4.4)
Cholesterol, Total: 176 mg/dL (ref 100–199)
HDL: 44 mg/dL (ref >39)
LDL CALC: 121 mg/dL — AB (ref 0–99)
TRIGLYCERIDES: 54 mg/dL (ref 0–149)
VLDL CHOLESTEROL CAL: 11 mg/dL (ref 5–40)

## 2017-03-31 LAB — HEMOGLOBIN A1C
ESTIMATED AVERAGE GLUCOSE: 103 mg/dL
Hgb A1c MFr Bld: 5.2 % (ref 4.8–5.6)

## 2017-03-31 LAB — VITAMIN D 25 HYDROXY (VIT D DEFICIENCY, FRACTURES): Vit D, 25-Hydroxy: 8.7 ng/mL — ABNORMAL LOW (ref 30.0–100.0)

## 2017-03-31 LAB — TSH: TSH: 1.11 u[IU]/mL (ref 0.450–4.500)

## 2017-04-02 ENCOUNTER — Other Ambulatory Visit: Payer: Self-pay | Admitting: Obstetrics & Gynecology

## 2017-04-02 ENCOUNTER — Encounter: Payer: Self-pay | Admitting: Obstetrics & Gynecology

## 2017-04-02 MED ORDER — VITAMIN D (ERGOCALCIFEROL) 1.25 MG (50000 UNIT) PO CAPS: 50000.0000 [IU] | ORAL_CAPSULE | ORAL | 2 refills | Status: DC

## 2017-06-05 ENCOUNTER — Ambulatory Visit (INDEPENDENT_AMBULATORY_CARE_PROVIDER_SITE_OTHER): Payer: Medicaid Other

## 2017-06-05 DIAGNOSIS — Z3042 Encounter for surveillance of injectable contraceptive: Secondary | ICD-10-CM

## 2017-06-05 MED ORDER — MEDROXYPROGESTERONE ACETATE 150 MG/ML IM SUSP
150.0000 mg | Freq: Once | INTRAMUSCULAR | Status: AC
  Administered 2017-06-05: 150 mg via INTRAMUSCULAR

## 2017-08-31 ENCOUNTER — Ambulatory Visit (INDEPENDENT_AMBULATORY_CARE_PROVIDER_SITE_OTHER): Payer: Medicaid Other

## 2017-08-31 ENCOUNTER — Ambulatory Visit: Payer: Medicaid Other

## 2017-08-31 DIAGNOSIS — Z3042 Encounter for surveillance of injectable contraceptive: Secondary | ICD-10-CM | POA: Diagnosis not present

## 2017-08-31 DIAGNOSIS — Z309 Encounter for contraceptive management, unspecified: Secondary | ICD-10-CM

## 2017-08-31 DIAGNOSIS — Z308 Encounter for other contraceptive management: Secondary | ICD-10-CM

## 2017-08-31 MED ORDER — MEDROXYPROGESTERONE ACETATE 150 MG/ML IM SUSP
150.0000 mg | Freq: Once | INTRAMUSCULAR | Status: AC
  Administered 2017-08-31: 150 mg via INTRAMUSCULAR

## 2017-08-31 NOTE — Progress Notes (Signed)
Pt here for depo inj which was given IM right glut.  NDC# [address]-2 

## 2017-11-23 ENCOUNTER — Ambulatory Visit: Payer: Medicaid Other

## 2017-11-23 ENCOUNTER — Ambulatory Visit (INDEPENDENT_AMBULATORY_CARE_PROVIDER_SITE_OTHER): Payer: Medicaid Other

## 2017-11-23 DIAGNOSIS — Z3042 Encounter for surveillance of injectable contraceptive: Secondary | ICD-10-CM | POA: Diagnosis not present

## 2017-11-23 DIAGNOSIS — Z309 Encounter for contraceptive management, unspecified: Secondary | ICD-10-CM

## 2017-11-23 MED ORDER — MEDROXYPROGESTERONE ACETATE 150 MG/ML IM SUSP
150.0000 mg | Freq: Once | INTRAMUSCULAR | Status: AC
  Administered 2017-11-23: 150 mg via INTRAMUSCULAR

## 2018-02-08 ENCOUNTER — Ambulatory Visit: Payer: Medicaid Other

## 2018-02-15 ENCOUNTER — Ambulatory Visit (INDEPENDENT_AMBULATORY_CARE_PROVIDER_SITE_OTHER): Payer: Medicaid Other

## 2018-02-15 DIAGNOSIS — Z309 Encounter for contraceptive management, unspecified: Secondary | ICD-10-CM

## 2018-02-15 DIAGNOSIS — Z3042 Encounter for surveillance of injectable contraceptive: Secondary | ICD-10-CM

## 2018-02-15 MED ORDER — MEDROXYPROGESTERONE ACETATE 150 MG/ML IM SUSP
150.0000 mg | Freq: Once | INTRAMUSCULAR | Status: AC
  Administered 2018-02-15: 150 mg via INTRAMUSCULAR

## 2018-03-27 ENCOUNTER — Ambulatory Visit (INDEPENDENT_AMBULATORY_CARE_PROVIDER_SITE_OTHER): Payer: Medicaid Other | Admitting: Obstetrics & Gynecology

## 2018-03-27 ENCOUNTER — Encounter: Payer: Self-pay | Admitting: Obstetrics & Gynecology

## 2018-03-27 VITALS — BP 120/80 | Ht 65.0 in | Wt 205.0 lb

## 2018-03-27 DIAGNOSIS — E559 Vitamin D deficiency, unspecified: Secondary | ICD-10-CM

## 2018-03-27 DIAGNOSIS — Z130 Encounter for screening for diseases of the blood and blood-forming organs and certain disorders involving the immune mechanism: Secondary | ICD-10-CM | POA: Diagnosis not present

## 2018-03-27 DIAGNOSIS — Z3042 Encounter for surveillance of injectable contraceptive: Secondary | ICD-10-CM | POA: Diagnosis not present

## 2018-03-27 DIAGNOSIS — Z0000 Encounter for general adult medical examination without abnormal findings: Secondary | ICD-10-CM

## 2018-03-27 HISTORY — DX: Encounter for surveillance of injectable contraceptive: Z30.42

## 2018-03-27 NOTE — Progress Notes (Signed)
HPI:      [name] is a 32 y.o. [id number] who LMP was No LMP recorded. Patient has had an injection.,she presents today for her annual examination. The patient has no complaints today. No periods on Depo. The patient is sexually active. Her last pap: approximate date 2018 and was normal. The patient does perform self breast exams.  There is no notable family history of breast or ovarian cancer in her family.  The patient has regular exercise: yes.  The patient denies current symptoms of depression.   Labs last year normal except low Vit D  GYN History: Contraception: Depo-Provera injections  PMHx: Past Medical History:  Diagnosis Date  . Medical history non-contributory   . Vaginal Pap smear with ASC-US    History reviewed. No pertinent surgical history. Family History  Problem Relation Age of Onset  . Hypertension Mother    Social History   Tobacco Use  . Smoking status: Former Games developer  . Smokeless tobacco: Never Used  Substance Use Topics  . Alcohol use: No  . Drug use: No    Current Outpatient Medications:  .  medroxyPROGESTERone (DEPO-PROVERA) 150 MG/ML injection, INJECT 1 MILLILITER INTO MUSCLE EVERY 3 MONTHS, Disp: 1 mL, Rfl: 3 .  Vitamin D, Ergocalciferol, (DRISDOL) 50000 units CAPS capsule, Take 1 capsule (50,000 Units total) by mouth every 7 (seven) days. For 3 months. (Patient not taking: Reported on 03/27/2018), Disp: 4 capsule, Rfl: 2 Allergies: Patient has no known allergies.  Review of Systems  Constitutional: Negative for chills, fever and malaise/fatigue.  HENT: Negative for congestion, sinus pain and sore throat.   Eyes: Negative for blurred vision and pain.  Respiratory: Negative for cough and wheezing.   Cardiovascular: Negative for chest pain and leg swelling.  Gastrointestinal: Negative for abdominal pain, constipation, diarrhea, heartburn, nausea and vomiting.  Genitourinary: Negative for dysuria, frequency, hematuria and urgency.    Musculoskeletal: Negative for back pain, joint pain, myalgias and neck pain.  Skin: Negative for itching and rash.  Neurological: Negative for dizziness, tremors and weakness.  Endo/Heme/Allergies: Does not bruise/bleed easily.  Psychiatric/Behavioral: Negative for depression. The patient is not nervous/anxious and does not have insomnia.    Objective: BP 120/80   Ht  (1.651 m)   Wt 205 lb (93 kg)   BMI 34.11 kg/m   Filed Weights   03/27/18 0838  Weight: 205 lb (93 kg)   Body mass index is 34.11 kg/m. Physical Exam  Constitutional: She is oriented to person, place, and time. She appears well-developed and well-nourished. No distress.  Genitourinary: Rectum normal, vagina normal and uterus normal. Pelvic exam was performed with patient supine. There is no rash or lesion on the right labia. There is no rash or lesion on the left labia. Vagina exhibits no lesion. No bleeding in the vagina. Right adnexum does not display mass and does not display tenderness. Left adnexum does not display mass and does not display tenderness. Cervix does not exhibit motion tenderness, lesion, friability or polyp.   Uterus is mobile and midaxial. Uterus is not enlarged or exhibiting a mass.  HENT:  Head: Normocephalic and atraumatic. Head is without laceration.  Right Ear: Hearing normal.  Left Ear: Hearing normal.  Nose: No epistaxis.  No foreign bodies.  Mouth/Throat: Uvula is midline, oropharynx is clear and moist and mucous membranes are normal.  Eyes: Pupils are equal, round, and reactive to light.  Neck: Normal range of motion. Neck supple. No thyromegaly present.  Cardiovascular: Normal  rate and regular rhythm. Exam reveals no gallop and no friction rub.  No murmur heard. Pulmonary/Chest: Effort normal and breath sounds normal. No respiratory distress. She has no wheezes. Right breast exhibits no mass, no skin change and no tenderness. Left breast exhibits no mass, no skin change and no  tenderness.  Abdominal: Soft. Bowel sounds are normal. She exhibits no distension. There is no tenderness. There is no rebound.  Musculoskeletal: Normal range of motion.  Neurological: She is alert and oriented to person, place, and time. No cranial nerve deficit.  Skin: Skin is warm and dry.  Psychiatric: She has a normal mood and affect. Judgment normal.  Vitals reviewed.  Assessment:  ANNUAL EXAM 1. Annual physical exam   2. Encounter for surveillance of injectable contraceptive   3. Vitamin D deficiency   4. Screening for deficiency anemia    Screening Plan:            1.  Cervical Screening-  Pap smear schedule reviewed with patient  2.  Labs Ordered today - Vit D level today, CBC for anemia  3. Counseling for contraception: Desires break from Depo.  Counseled as to all options for contraception, none except condoms desired now.  Considering Paraguard in future    F/U  Return in about 1 year (around 03/28/2019) for Annual.  Annamarie Major, MD, [facility], [facility] 03/27/2018  9:27 AM

## 2018-03-27 NOTE — Patient Instructions (Addendum)
PAP every three years Labs yearly    

## 2018-03-28 ENCOUNTER — Other Ambulatory Visit: Payer: Self-pay | Admitting: Obstetrics & Gynecology

## 2018-03-28 LAB — CBC
HEMATOCRIT: 41.4 % (ref 34.0–46.6)
Hemoglobin: 14.1 g/dL (ref 11.1–15.9)
MCH: 31.1 pg (ref 26.6–33.0)
MCHC: 34.1 g/dL (ref 31.5–35.7)
MCV: 91 fL (ref 79–97)
Platelets: 260 10*3/uL (ref 150–379)
RBC: 4.53 x10E6/uL (ref 3.77–5.28)
RDW: 13.5 % (ref 12.3–15.4)
WBC: 5.7 10*3/uL (ref 3.4–10.8)

## 2018-03-28 LAB — VITAMIN D 25 HYDROXY (VIT D DEFICIENCY, FRACTURES): VIT D 25 HYDROXY: 11.1 ng/mL — AB (ref 30.0–100.0)

## 2018-03-28 MED ORDER — VITAMIN D (ERGOCALCIFEROL) 1.25 MG (50000 UNIT) PO CAPS: 50000.0000 [IU] | ORAL_CAPSULE | ORAL | 2 refills | Status: DC

## 2018-05-14 ENCOUNTER — Emergency Department: Payer: Medicaid Other

## 2018-05-14 ENCOUNTER — Emergency Department
Admission: EM | Admit: 2018-05-14 | Discharge: 2018-05-14 | Disposition: A | Discharge status: Home / Self Care | Payer: Medicaid Other | Attending: Emergency Medicine | Admitting: Emergency Medicine

## 2018-05-14 ENCOUNTER — Other Ambulatory Visit: Payer: Self-pay

## 2018-05-14 ENCOUNTER — Encounter: Payer: Self-pay | Admitting: Emergency Medicine

## 2018-05-14 DIAGNOSIS — Z79899 Other long term (current) drug therapy: Secondary | ICD-10-CM | POA: Insufficient documentation

## 2018-05-14 DIAGNOSIS — Z87891 Personal history of nicotine dependence: Secondary | ICD-10-CM | POA: Insufficient documentation

## 2018-05-14 DIAGNOSIS — R0789 Other chest pain: Secondary | ICD-10-CM | POA: Diagnosis not present

## 2018-05-14 DIAGNOSIS — R079 Chest pain, unspecified: Secondary | ICD-10-CM | POA: Diagnosis present

## 2018-05-14 LAB — BASIC METABOLIC PANEL WITH GFR
Anion gap: 8 (ref 5–15)
BUN: 9 mg/dL (ref 6–20)
CO2: 19 mmol/L — ABNORMAL LOW (ref 22–32)
Calcium: 8.7 mg/dL — ABNORMAL LOW (ref 8.9–10.3)
Chloride: 109 mmol/L (ref 98–111)
Creatinine, Ser: 0.74 mg/dL (ref 0.44–1.00)
GFR calc Af Amer: >60 mL/min
GFR calc non Af Amer: >60 mL/min
Glucose, Bld: 118 mg/dL — ABNORMAL HIGH (ref 70–99)
Potassium: 3.4 mmol/L — ABNORMAL LOW (ref 3.5–5.1)
Sodium: 136 mmol/L (ref 135–145)

## 2018-05-14 LAB — CBC
HCT: 42.3 % (ref 35.0–47.0)
Hemoglobin: 14.8 g/dL (ref 12.0–16.0)
MCH: 31.1 pg (ref 26.0–34.0)
MCHC: 34.9 g/dL (ref 32.0–36.0)
MCV: 89.2 fL (ref 80.0–100.0)
PLATELETS: 262 10*3/uL (ref 150–440)
RBC: 4.75 MIL/uL (ref 3.80–5.20)
RDW: 13.2 % (ref 11.5–14.5)
WBC: 7.6 10*3/uL (ref 3.6–11.0)

## 2018-05-14 LAB — FIBRIN DERIVATIVES D-DIMER (ARMC ONLY): FIBRIN DERIVATIVES D-DIMER (ARMC): 1793.12 ng{FEU}/mL — AB (ref 0.00–499.00)

## 2018-05-14 LAB — POCT PREGNANCY, URINE: PREG TEST UR: NEGATIVE

## 2018-05-14 LAB — TROPONIN I: Troponin I: <0.03 ng/mL

## 2018-05-14 MED ORDER — SODIUM CHLORIDE 0.9 % IV BOLUS: 1000.0000 mL | Freq: Once | INTRAVENOUS | Status: DC

## 2018-05-14 MED ORDER — IBUPROFEN 400 MG PO TABS
600.0000 mg | ORAL_TABLET | Freq: Once | ORAL | Status: AC
Start: 2018-05-14 — End: 2018-05-14
  Administered 2018-05-14: 600 mg via ORAL
  Filled 2018-05-14: qty 2

## 2018-05-14 MED ORDER — IOHEXOL 350 MG/ML SOLN
75.0000 mL | Freq: Once | INTRAVENOUS | Status: AC | PRN
  Administered 2018-05-14: 75 mL via INTRAVENOUS
  Filled 2018-05-14: qty 75

## 2018-05-14 NOTE — ED Notes (Signed)
Patient transported to CT 

## 2018-05-14 NOTE — ED Notes (Signed)
Patient transported to X-ray 

## 2018-05-14 NOTE — ED Notes (Signed)
Left sided chest pain radiating to left shoulder. Ambulatory.

## 2018-05-14 NOTE — ED Provider Notes (Addendum)
[facility] Emergency Department Provider Note ____________________________________________   First MD Initiated Contact with Patient 05/14/18 1736     (approximate)  I have reviewed the triage vital signs and the nursing notes.   HISTORY  Chief Complaint Chest Pain  HPI Joann Clark is a 32 y.o. female without any chronic medical conditions, on Depakote, who is presenting to the emergency department chest pain over the past 24 hours.  Says that the pain was first apparent in her left shoulder and has now moved to the left, upper portion of her chest.  Says the pain is a 9 out of 10 and worse with deep breathing and movement of the left shoulder girdle.  However, she says that she is able to move her left upper extremity.  She does not report any cough or fever.  No history of blood clot.  Says that she smokes cigarettes.  Past Medical History:  Diagnosis Date  . Medical history non-contributory   . Vaginal Pap smear with ASC-US     Patient Active Problem List   Diagnosis Date Noted  . Encounter for surveillance of injectable contraceptive 03/27/2018  . Vitamin D deficiency 03/27/2018    History reviewed. No pertinent surgical history.  Prior to Admission medications   Medication Sig Start Date End Date Taking? Authorizing Provider  medroxyPROGESTERone (DEPO-PROVERA) 150 MG/ML injection INJECT 1 MILLILITER INTO MUSCLE EVERY 3 MONTHS 03/26/17   Nadara MustardHarris, Robert P, MD  Vitamin D, Ergocalciferol, (DRISDOL) 50000 units CAPS capsule Take 1 capsule (50,000 Units total) by mouth every 7 (seven) days. For 3 months. 03/28/18   Nadara MustardHarris, Robert P, MD    Allergies Patient has no known allergies.  Family History  Problem Relation Age of Onset  . Hypertension Mother     Social History Social History   Tobacco Use  . Smoking status: Former Games developermoker  . Smokeless tobacco: Never Used  Substance Use Topics  . Alcohol use: No  . Drug use: No    Review of  Systems  Constitutional: No fever/chills Eyes: No visual changes. ENT: No sore throat. Cardiovascular: As above Respiratory: As above Gastrointestinal: No abdominal pain.  No nausea, no vomiting.  No diarrhea.  No constipation. Genitourinary: Negative for dysuria. Musculoskeletal: Negative for back pain. Skin: Negative for rash. Neurological: Negative for headaches, focal weakness or numbness.   ____________________________________________   PHYSICAL EXAM:  VITAL SIGNS: ED Triage Vitals  Enc Vitals Group     BP 05/14/18 1713 117/82     Pulse Rate 05/14/18 1713 (!) 107     Resp 05/14/18 1713 20     Temp 05/14/18 1713 98.9 F (37.2 C)     Temp Source 05/14/18 1713 Oral     SpO2 05/14/18 1713 96 %     Weight 05/14/18 1714 210 lb (95.3 kg)     Height 05/14/18 1714 5\' 5"  (1.651 m)     Head Circumference --      Peak Flow --      Pain Score 05/14/18 1714 10     Pain Loc --      Pain Edu? --      Excl. in GC? --     Constitutional: Alert and oriented. Well appearing and in no acute distress. Eyes: Conjunctivae are normal.  Head: Atraumatic. Nose: No congestion/rhinnorhea. Mouth/Throat: Mucous membranes are moist.  Neck: No stridor.   Cardiovascular: Normal rate, regular rhythm. Grossly normal heart sounds.  Good peripheral circulation with equal and bilateral radial pulses.  Chest pain is reproducible to palpation of the left upper and outer quadrant of the left pectoralis major muscle.  No crepitus, no ecchymosis.  No deformity.  Heart rate taken at the bedside with patient at rest and goes down to 97 bpm. Respiratory: Normal respiratory effort.  No retractions. Lungs CTAB. Gastrointestinal: Soft and nontender. No distention.  Musculoskeletal: No lower extremity tenderness nor edema.  No joint effusions. Neurologic:  Normal speech and language. No gross focal neurologic deficits are appreciated. Skin:  Skin is warm, dry and intact. No rash noted. Psychiatric: Mood and  affect are normal. Speech and behavior are normal.  ____________________________________________   LABS (all labs ordered are listed, but only abnormal results are displayed)  Labs Reviewed  BASIC METABOLIC PANEL - Abnormal; Notable for the following components:      Result Value   Potassium 3.4 (*)    CO2 19 (*)    Glucose, Bld 118 (*)    Calcium 8.7 (*)    All other components within normal limits  FIBRIN DERIVATIVES D-DIMER ([facility] ONLY) - Abnormal; Notable for the following components:   Fibrin derivatives D-dimer [facility]) 1,610.96 (*)    All other components within normal limits  CBC  TROPONIN I  POC URINE PREG, ED  POCT PREGNANCY, URINE   ____________________________________________  EKG  ED ECG REPORT I, Arelia Longest, the attending physician, personally viewed and interpreted this ECG.   Date: 05/14/2018  EKG Time: 1714  Rate: 96  Rhythm: normal sinus rhythm  Axis: Normal  Intervals:none  ST&T Change: No ST segment elevation or depression.  No abnormal T wave inversion.  ____________________________________________  RADIOLOGY  Mild left lower lobe atelectasis versus infiltrate.  CT examination with mild basilar atelectasis greater on the left.  No PE. ____________________________________________   PROCEDURES  Procedure(s) performed:   Procedures  Critical Care performed:   ____________________________________________   INITIAL IMPRESSION / ASSESSMENT AND PLAN / ED COURSE  Pertinent labs & imaging results that were available during my care of the patient were reviewed by me and considered in my medical decision making (see chart for details).  Differential diagnosis includes, but is not limited to, ACS, aortic dissection, pulmonary embolism, cardiac tamponade, pneumothorax, pneumonia, pericarditis, myocarditis, GI-related causes including esophagitis/gastritis, and musculoskeletal chest wall pain.   As part of my medical decision making, I  reviewed the following data within the electronic MEDICAL RECORD NUMBER Notes from prior ED visits  ----------------------------------------- 7:23 PM on 05/14/2018 -----------------------------------------  Patient at this time without further complaint.  Reassuring CAT scan with just evidence of atelectasis but without obvious evidence of PE.  Exam is consistent with chest wall irritation.  Patient says now that she was folding laundry for hours yesterday and this could have exacerbated the left shoulder.  I recommended that she take ibuprofen as well as use topical salve such as icy hot or Aspercreme.  She is understanding of the diagnosis as well as treatment plan willing to comply.  We also discussed smoking cessation. ____________________________________________   FINAL CLINICAL IMPRESSION(S) / ED DIAGNOSES  Chest wall pain.    NEW MEDICATIONS STARTED DURING THIS VISIT:  New Prescriptions   No medications on file     Note:  This document was prepared using Dragon voice recognition software and may include unintentional dictation errors.     Myrna Blazer, MD 05/14/18 1924    Pershing Proud Myra Rude, MD 05/14/18 [id number]

## 2018-05-14 NOTE — ED Triage Notes (Signed)
Pt reports that she develops left shoulder pain and chest pain that began yesterday, She reports some SOB, and diaphoresis. She reports that the pain radiates from chest into shoulder.

## 2018-05-14 NOTE — ED Notes (Signed)
MD at bedside with patient to provide update.

## 2018-05-17 ENCOUNTER — Telehealth: Payer: Self-pay | Admitting: Emergency Medicine

## 2018-06-21 ENCOUNTER — Other Ambulatory Visit: Payer: Self-pay | Admitting: Obstetrics & Gynecology

## 2018-06-21 NOTE — Telephone Encounter (Signed)
Please advise 

## 2018-06-26 ENCOUNTER — Telehealth: Payer: Self-pay

## 2018-06-26 NOTE — Telephone Encounter (Signed)
Pt is wondering if she need to come in to get blood work done or if she should continue to take the Vit D? I called pt and she stated she would like to wait for Dr. Tiburcio PeaHarris to get back so that he can make the decision and not someone else. Please advise Thank you

## 2018-07-01 ENCOUNTER — Other Ambulatory Visit: Payer: Self-pay | Admitting: Obstetrics & Gynecology

## 2018-07-01 DIAGNOSIS — E559 Vitamin D deficiency, unspecified: Secondary | ICD-10-CM

## 2018-07-01 NOTE — Telephone Encounter (Signed)
Lab ordered to recheck level, at her convenience

## 2018-07-02 NOTE — Telephone Encounter (Signed)
Left detailed message to make pt aware of labs

## 2018-07-05 ENCOUNTER — Other Ambulatory Visit: Payer: Medicaid Other

## 2018-07-05 DIAGNOSIS — E559 Vitamin D deficiency, unspecified: Secondary | ICD-10-CM

## 2018-07-06 LAB — VITAMIN D 25 HYDROXY (VIT D DEFICIENCY, FRACTURES): Vit D, 25-Hydroxy: 29.2 ng/mL — ABNORMAL LOW (ref 30.0–100.0)

## 2019-04-01 ENCOUNTER — Ambulatory Visit: Payer: Medicaid Other | Admitting: Obstetrics & Gynecology

## 2019-04-01 ENCOUNTER — Other Ambulatory Visit: Payer: Self-pay

## 2019-05-07 ENCOUNTER — Ambulatory Visit (INDEPENDENT_AMBULATORY_CARE_PROVIDER_SITE_OTHER): Payer: Medicaid Other | Admitting: Obstetrics & Gynecology

## 2019-05-07 ENCOUNTER — Encounter: Payer: Self-pay | Admitting: Obstetrics & Gynecology

## 2019-05-07 ENCOUNTER — Other Ambulatory Visit: Payer: Self-pay

## 2019-05-07 VITALS — BP 100/70 | Ht 65.0 in | Wt 222.0 lb

## 2019-05-07 DIAGNOSIS — Z0000 Encounter for general adult medical examination without abnormal findings: Secondary | ICD-10-CM

## 2019-05-07 DIAGNOSIS — Z01419 Encounter for gynecological examination (general) (routine) without abnormal findings: Secondary | ICD-10-CM

## 2019-05-07 DIAGNOSIS — E559 Vitamin D deficiency, unspecified: Secondary | ICD-10-CM

## 2019-05-07 NOTE — Progress Notes (Signed)
HPI:      Joann Clark is a 33 y.o. [id number] who LMP was Patient's last menstrual period was 05/05/2019., she presents today for her annual examination. The patient has no complaints today. The patient is sexually active. Her last pap: approximate date 2018 and was normal. The patient does perform self breast exams.  There is no notable family history of breast or ovarian cancer in her family.  The patient has regular exercise: yes.  The patient denies current symptoms of depression.  Vit D low last year, follow up testing after Rx Vit D normal; has not been taking daily supplements.  GYN History: Contraception: none  Gained weight after stopping Depo last year Periods have resumed monthly and heavy, 7 days  PMHx: Past Medical History:  Diagnosis Date  . Medical history non-contributory   . Vaginal Pap smear with ASC-US    History reviewed. No pertinent surgical history. Family History  Problem Relation Age of Onset  . Hypertension Mother    Social History   Tobacco Use  . Smoking status: Former Games developermoker  . Smokeless tobacco: Never Used  Substance Use Topics  . Alcohol use: No  . Drug use: No    Current Outpatient Medications:  .  medroxyPROGESTERone (DEPO-PROVERA) 150 MG/ML injection, INJECT 1 MILLILITER INTO MUSCLE EVERY 3 MONTHS (Patient not taking: Reported on 05/07/2019), Disp: 1 mL, Rfl: 3 .  Vitamin D, Ergocalciferol, (DRISDOL) 50000 units CAPS capsule, TAKE 1 CAPSULE (50,000 UNITS TOTAL) BY MOUTH EVERY 7 (SEVEN) DAYS. FOR 3 MONTHS. (Patient not taking: Reported on 05/07/2019), Disp: 4 capsule, Rfl: 2 Allergies: Patient has no known allergies.  Review of Systems  Constitutional: Negative for chills, fever and malaise/fatigue.  HENT: Negative for congestion, sinus pain and sore throat.   Eyes: Negative for blurred vision and pain.  Respiratory: Negative for cough and wheezing.   Cardiovascular: Negative for chest pain and leg swelling.  Gastrointestinal: Negative for  abdominal pain, constipation, diarrhea, heartburn, nausea and vomiting.  Genitourinary: Negative for dysuria, frequency, hematuria and urgency.  Musculoskeletal: Negative for back pain, joint pain, myalgias and neck pain.  Skin: Negative for itching and rash.  Neurological: Negative for dizziness, tremors and weakness.  Endo/Heme/Allergies: Does not bruise/bleed easily.  Psychiatric/Behavioral: Negative for depression. The patient is not nervous/anxious and does not have insomnia.     Objective: BP 100/70   Ht 5\' 5"  (1.651 m)   Wt 222 lb (100.7 kg)   LMP 05/05/2019   BMI 36.94 kg/m   Filed Weights   05/07/19 1340  Weight: 222 lb (100.7 kg)   Body mass index is 36.94 kg/m. Physical Exam Constitutional:      General: She is not in acute distress.    Appearance: She is well-developed.  Genitourinary:     Pelvic exam was performed with patient supine.     Vagina, uterus and rectum normal.     No lesions in the vagina.     No vaginal bleeding.     No cervical motion tenderness, friability, lesion or polyp.     Uterus is mobile.     Uterus is not enlarged.     No uterine mass detected.    Uterus is midaxial.     No right or left adnexal mass present.     Right adnexa not tender.     Left adnexa not tender.  HENT:     Head: Normocephalic and atraumatic. No laceration.     Right Ear: Hearing normal.  Left Ear: Hearing normal.     Mouth/Throat:     Pharynx: Uvula midline.  Eyes:     Pupils: Pupils are equal, round, and reactive to light.  Neck:     Musculoskeletal: Normal range of motion and neck supple.     Thyroid: No thyromegaly.  Cardiovascular:     Rate and Rhythm: Normal rate and regular rhythm.     Heart sounds: No murmur. No friction rub. No gallop.   Pulmonary:     Effort: Pulmonary effort is normal. No respiratory distress.     Breath sounds: Normal breath sounds. No wheezing.  Chest:     Breasts:        Right: No mass, skin change or tenderness.         Left: No mass, skin change or tenderness.  Abdominal:     General: Bowel sounds are normal. There is no distension.     Palpations: Abdomen is soft.     Tenderness: There is no abdominal tenderness. There is no rebound.  Musculoskeletal: Normal range of motion.  Neurological:     Mental Status: She is alert and oriented to person, place, and time.     Cranial Nerves: No cranial nerve deficit.  Skin:    General: Skin is warm and dry.  Psychiatric:        Judgment: Judgment normal.  Vitals signs reviewed.     Assessment:  ANNUAL EXAM 1. Women's annual routine gynecological examination   2. Vitamin D deficiency      Screening Plan:            1.  Cervical Screening-  Pap smear schedule reviewed with patient  2. Breast screening- Exam annually and mammogram>40 planned   3. Counseling for contraception: no method desired Considering Depo again for menorrhagia  4. Vitamin D deficiency - VITAMIN D 25 Hydroxy (Vit-D Deficiency, Fractures)      F/U  Return in about 1 year (around 05/06/2020) for Annual.  Barnett Applebaum, MD, [facility], [facility] 05/07/2019  1:58 PM

## 2019-05-07 NOTE — Patient Instructions (Signed)
PAP every three years Labs today  

## 2019-05-08 ENCOUNTER — Other Ambulatory Visit: Payer: Self-pay | Admitting: Obstetrics & Gynecology

## 2019-05-08 DIAGNOSIS — E559 Vitamin D deficiency, unspecified: Secondary | ICD-10-CM

## 2019-05-08 LAB — VITAMIN D 25 HYDROXY (VIT D DEFICIENCY, FRACTURES): Vit D, 25-Hydroxy: 13.7 ng/mL — ABNORMAL LOW (ref 30.0–100.0)

## 2019-05-08 MED ORDER — VITAMIN D (ERGOCALCIFEROL) 1.25 MG (50000 UNIT) PO CAPS: 50000.0000 [IU] | ORAL_CAPSULE | ORAL | 2 refills | Status: DC

## 2019-05-08 NOTE — Progress Notes (Signed)
Vitamin D level is low and in need of supplementation. I recommend you take prescription level dosing of Vit D- 50,000 units Weekly- for 12 weeks, and then take 1000 U per day of over-the-counter Vitamin D thereafter.  We can recheck it again after you've done this for a couple of months. Vitamin D is important for bone health maintenance as well as other benefits.  1. Vitamin D deficiency - Vitamin D, Ergocalciferol, (DRISDOL) 1.25 MG (50000 UT) CAPS capsule; Take 1 capsule (50,000 Units total) by mouth every 7 (seven) days. For 3 months.  Dispense: 4 capsule; Refill: 2  Barnett Applebaum, MD, [facility], [facility] 05/08/2019  7:41 AM

## 2019-05-08 NOTE — Progress Notes (Signed)
Let her know, Vitamin D very low again, and Rx fro Vit D called in to pharmacy; take weekly for three months, then take over the counter supplement daily

## 2019-05-27 IMAGING — CT CT ANGIO CHEST
2 of 7 series · 19 of 46 positions shown · IV contrast (APPLIED)
Comparison: Chest radiographs obtained earlier today.

ADDENDUM:
Also noted is a small hiatal hernia.
CLINICAL DATA: Chest and left shoulder pain since yesterday with
some shortness of breath and diaphoresis.

EXAM:
CT ANGIOGRAPHY CHEST WITH CONTRAST
TECHNIQUE: Multidetector CT imaging of the chest was performed using the
standard protocol during bolus administration of intravenous
contrast. Multiplanar CT image reconstructions and MIPs were
obtained to evaluate the vascular anatomy.
CONTRAST:  75mL OMNIPAQUE IOHEXOL 350 MG/ML SOLN

[Series 5: thins · axial · 0.57mm/px · z∈[-696,-457]mm · 16 of 269 slices shown]
[im 15/269  lung]
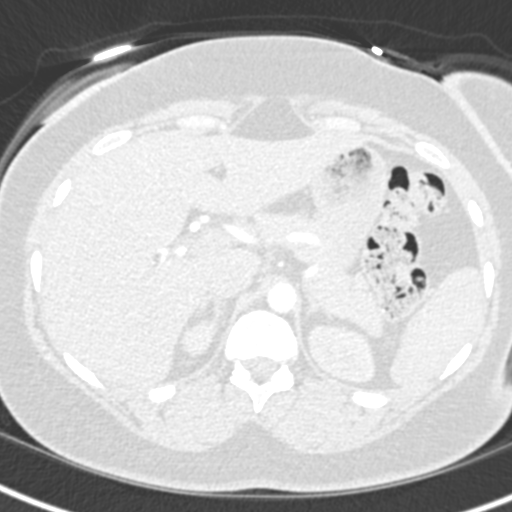
[im 29/269  soft-tissue]
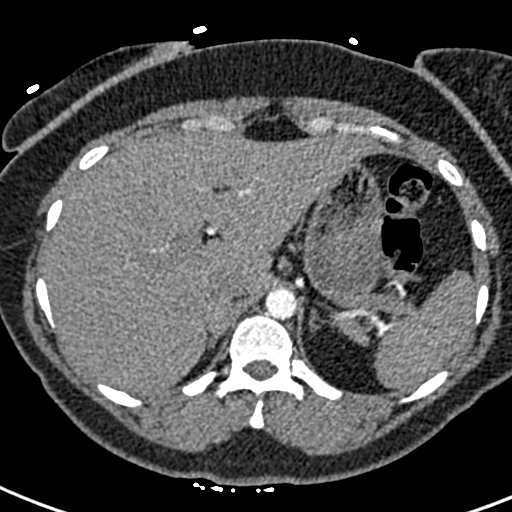
[im 43/269  lung]
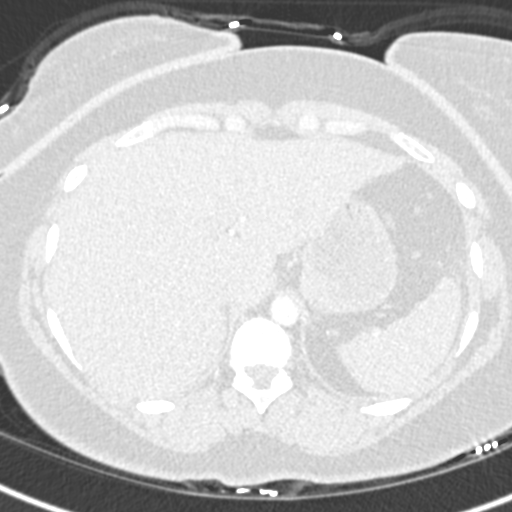
[im 57/269  soft-tissue]
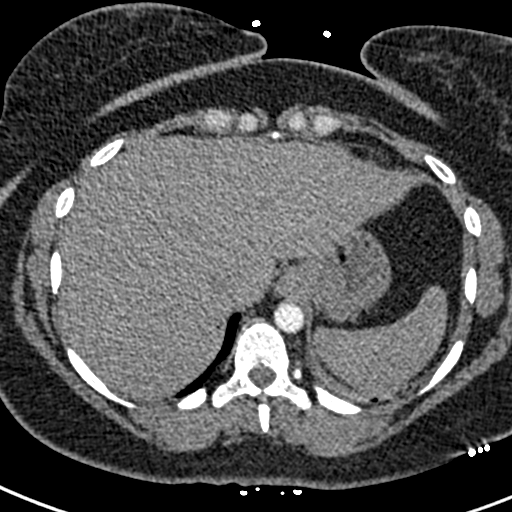
[im 85/269  lung]
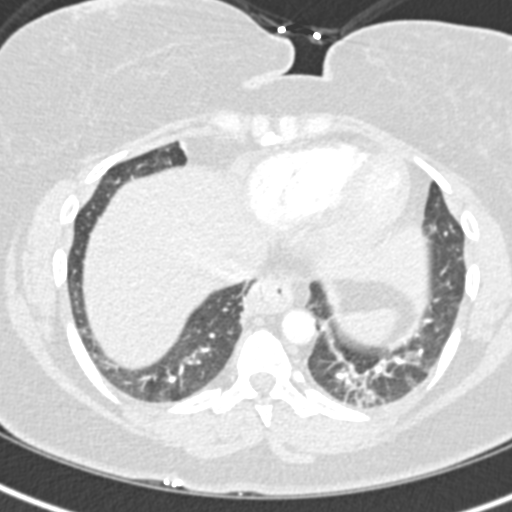
[im 99/269  soft-tissue]
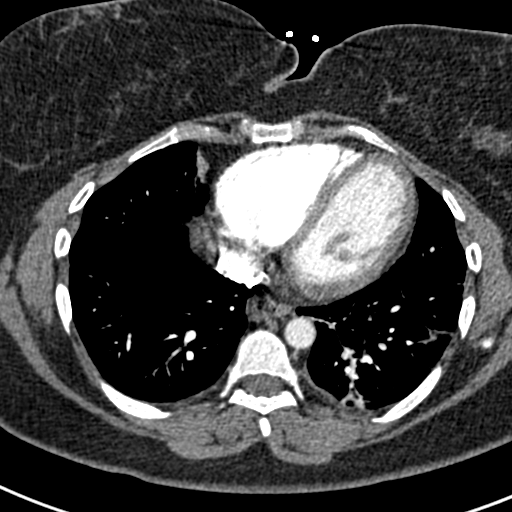
[im 113/269  lung]
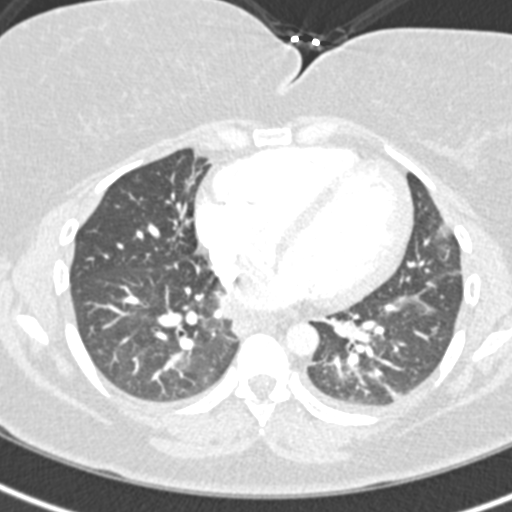
[im 127/269  soft-tissue]
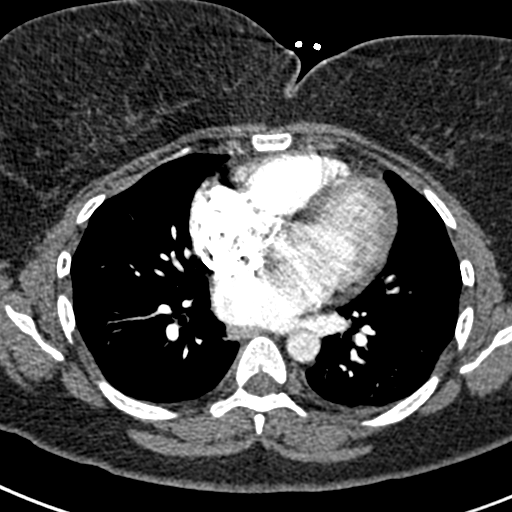
[im 142/269  lung]
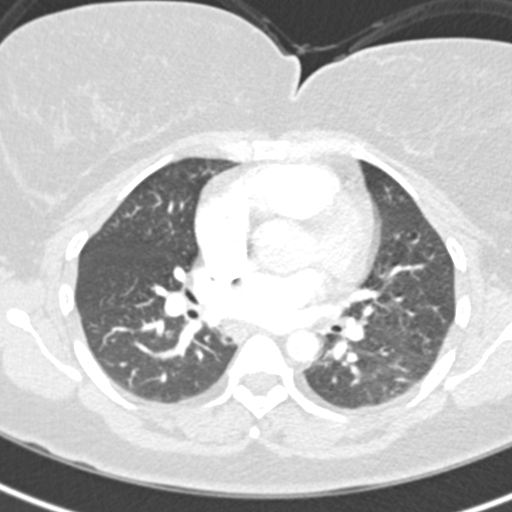
[im 156/269  soft-tissue]
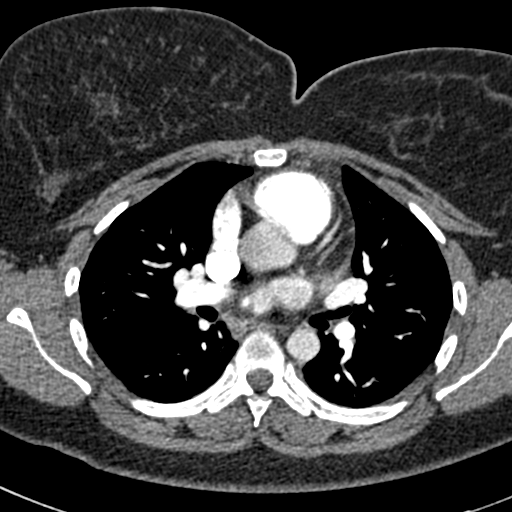
[im 170/269  lung]
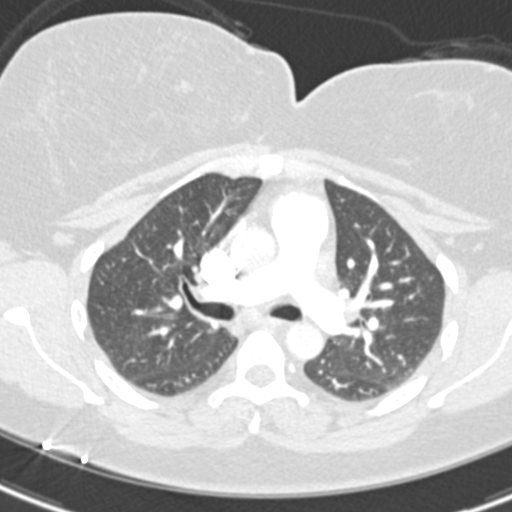
[im 184/269  soft-tissue]
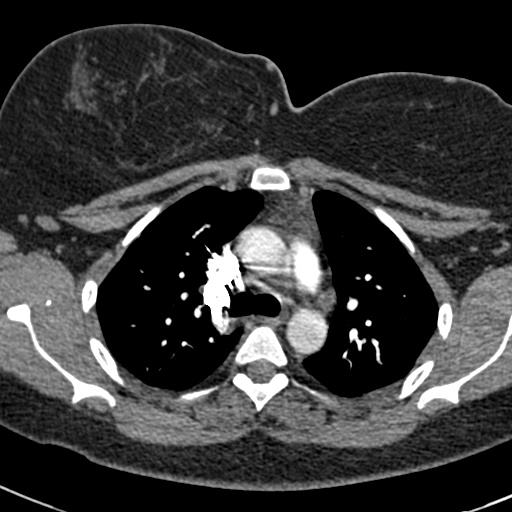
[im 212/269  lung]
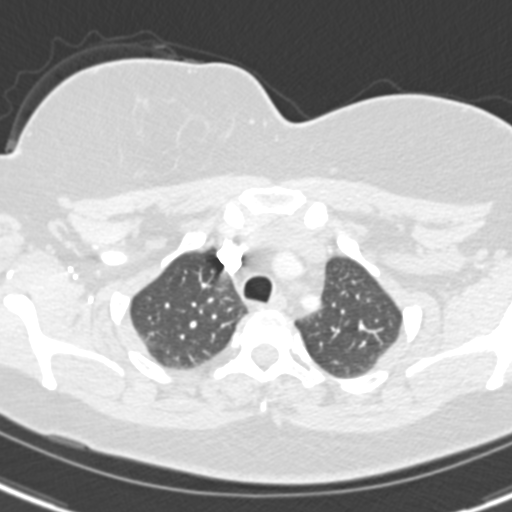
[im 226/269  soft-tissue]
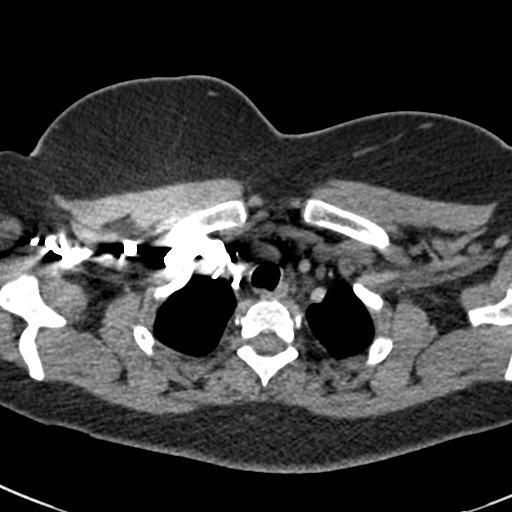
[im 240/269  lung]
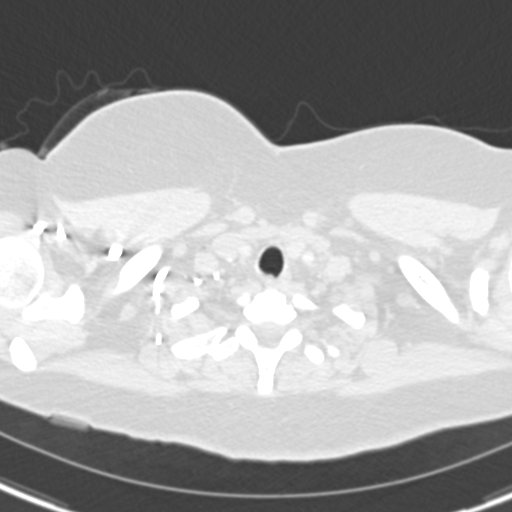
[im 254/269  soft-tissue]
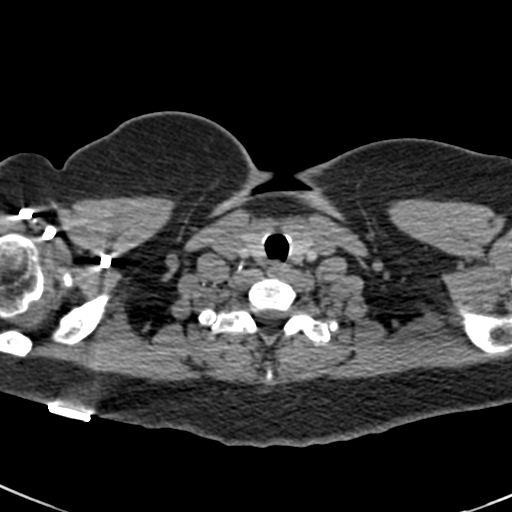

[Series 7: coronal mpr · coronal · 0.53mm/px · 3 of 66 slices shown]
[im 17/66  soft-tissue]
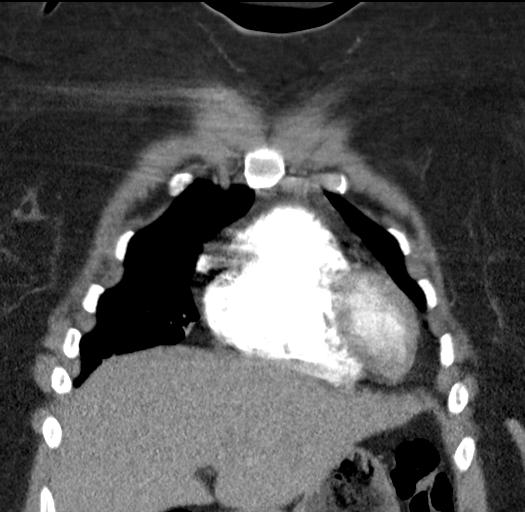
[im 33/66  soft-tissue]
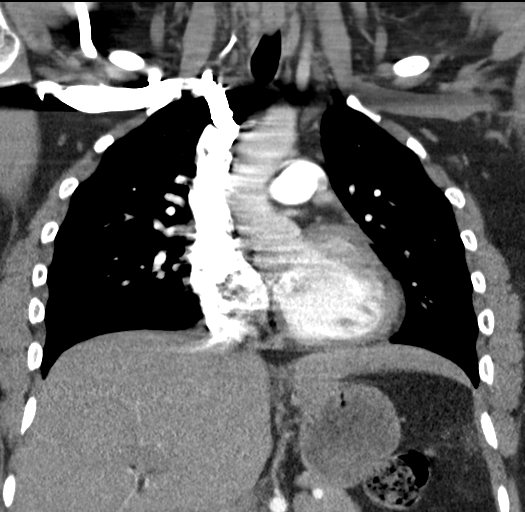
[im 49/66  soft-tissue]
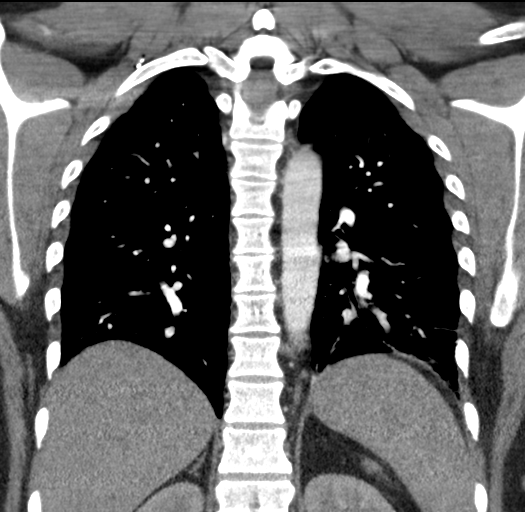

[19 of 46 positions shown; findings below may reference images not displayed]

FINDINGS: Cardiovascular: Satisfactory opacification of the pulmonary arteries
to the segmental level. No evidence of pulmonary embolism. Normal
heart size. No pericardial effusion.

Mediastinum/Nodes: No enlarged mediastinal, hilar, or axillary lymph
nodes. Thyroid gland, trachea, and esophagus demonstrate no
significant findings.

Lungs/Pleura: Mild bibasilar linear atelectasis, greater on the
left. No pleural fluid.

Upper Abdomen: Unremarkable.

Musculoskeletal: Unremarkable bones.

Review of the MIP images confirms the above findings.
IMPRESSION: 1. No pulmonary emboli.
2. Mild bibasilar atelectasis, greater on the left.

## 2019-10-15 ENCOUNTER — Other Ambulatory Visit: Payer: Self-pay

## 2019-10-15 DIAGNOSIS — Z20828 Contact with and (suspected) exposure to other viral communicable diseases: Secondary | ICD-10-CM | POA: Diagnosis not present

## 2019-10-15 DIAGNOSIS — Z20822 Contact with and (suspected) exposure to covid-19: Secondary | ICD-10-CM

## 2019-10-17 LAB — NOVEL CORONAVIRUS, NAA: SARS-CoV-2, NAA: NOT DETECTED

## 2019-12-22 ENCOUNTER — Other Ambulatory Visit: Payer: Medicaid Other

## 2020-04-27 ENCOUNTER — Ambulatory Visit: Payer: Medicaid Other | Attending: Internal Medicine

## 2020-04-27 ENCOUNTER — Other Ambulatory Visit: Payer: Self-pay

## 2020-04-27 DIAGNOSIS — Z20822 Contact with and (suspected) exposure to covid-19: Secondary | ICD-10-CM | POA: Diagnosis not present

## 2020-04-28 LAB — NOVEL CORONAVIRUS, NAA: SARS-CoV-2, NAA: NOT DETECTED

## 2020-04-28 LAB — SARS-COV-2, NAA 2 DAY TAT

## 2020-05-20 ENCOUNTER — Other Ambulatory Visit (HOSPITAL_COMMUNITY)
Admission: RE | Admit: 2020-05-20 | Discharge: 2020-05-20 | Disposition: A | Discharge status: Home / Self Care | Payer: Medicaid Other | Source: Ambulatory Visit | Attending: Obstetrics & Gynecology | Admitting: Obstetrics & Gynecology

## 2020-05-20 ENCOUNTER — Encounter: Payer: Self-pay | Admitting: Obstetrics & Gynecology

## 2020-05-20 ENCOUNTER — Other Ambulatory Visit: Payer: Self-pay

## 2020-05-20 ENCOUNTER — Ambulatory Visit (INDEPENDENT_AMBULATORY_CARE_PROVIDER_SITE_OTHER): Payer: Medicaid Other | Admitting: Obstetrics & Gynecology

## 2020-05-20 VITALS — BP 120/80 | Ht 65.0 in | Wt 195.0 lb

## 2020-05-20 DIAGNOSIS — Z1329 Encounter for screening for other suspected endocrine disorder: Secondary | ICD-10-CM

## 2020-05-20 DIAGNOSIS — Z131 Encounter for screening for diabetes mellitus: Secondary | ICD-10-CM

## 2020-05-20 DIAGNOSIS — Z124 Encounter for screening for malignant neoplasm of cervix: Secondary | ICD-10-CM | POA: Diagnosis not present

## 2020-05-20 DIAGNOSIS — Z1321 Encounter for screening for nutritional disorder: Secondary | ICD-10-CM

## 2020-05-20 DIAGNOSIS — Z13220 Encounter for screening for lipoid disorders: Secondary | ICD-10-CM

## 2020-05-20 DIAGNOSIS — Z01419 Encounter for gynecological examination (general) (routine) without abnormal findings: Secondary | ICD-10-CM | POA: Diagnosis not present

## 2020-05-20 DIAGNOSIS — Z800 Family history of malignant neoplasm of digestive organs: Secondary | ICD-10-CM

## 2020-05-20 NOTE — Progress Notes (Signed)
HPI:      [name] is a 34 y.o. [id number] who LMP was Patient's last menstrual period was 05/15/2020., she presents today for her annual examination. The patient has no complaints today. The patient is sexually active. Her last pap: approximate date 2018 and was normal and last mammogram: patient has never had a mammogram. The patient does perform self breast exams.  There is no notable family history of breast or ovarian cancer in her family, but does have pancreatic cancer in [facility]  The patient has regular exercise: yes.  The patient denies current symptoms of depression.    GYN History: Contraception: none  (has been off of Depo 2 years, condoms)    Reg cycles  PMHx: Past Medical History:  Diagnosis Date  . Medical history non-contributory   . Vaginal Pap smear with ASC-US    History reviewed. No pertinent surgical history. Family History  Problem Relation Age of Onset  . Hypertension Mother    Social History   Tobacco Use  . Smoking status: Former Research scientist (life sciences)  . Smokeless tobacco: Never Used  Vaping Use  . Vaping Use: Never used  Substance Use Topics  . Alcohol use: No  . Drug use: No   No current outpatient medications on file. Allergies: Patient has no known allergies.  Review of Systems  Constitutional: Negative for chills, fever and malaise/fatigue.  HENT: Negative for congestion, sinus pain and sore throat.   Eyes: Negative for blurred vision and pain.  Respiratory: Negative for cough and wheezing.   Cardiovascular: Negative for chest pain and leg swelling.  Gastrointestinal: Negative for abdominal pain, constipation, diarrhea, heartburn, nausea and vomiting.  Genitourinary: Negative for dysuria, frequency, hematuria and urgency.  Musculoskeletal: Negative for back pain, joint pain, myalgias and neck pain.  Skin: Negative for itching and rash.  Neurological: Negative for dizziness, tremors and weakness.  Endo/Heme/Allergies: Does not bruise/bleed easily.    Psychiatric/Behavioral: Negative for depression. The patient is not nervous/anxious and does not have insomnia.     Objective: BP 120/80   Ht '5\' 5"'$  (1.651 m)   Wt 195 lb (88.5 kg)   LMP 05/15/2020   BMI 32.45 kg/m   Filed Weights   05/20/20 1011  Weight: 195 lb (88.5 kg)   Body mass index is 32.45 kg/m. Physical Exam Constitutional:      General: She is not in acute distress.    Appearance: She is well-developed.  Genitourinary:     Pelvic exam was performed with patient supine.     Vagina, uterus and rectum normal.     No lesions in the vagina.     No vaginal bleeding.     No cervical motion tenderness, friability, lesion or polyp.     Uterus is mobile.     Uterus is not enlarged.     No uterine mass detected.    Uterus is midaxial.     No right or left adnexal mass present.     Right adnexa not tender.     Left adnexa not tender.  HENT:     Head: Normocephalic and atraumatic. No laceration.     Right Ear: Hearing normal.     Left Ear: Hearing normal.     Mouth/Throat:     Pharynx: Uvula midline.  Eyes:     Pupils: Pupils are equal, round, and reactive to light.  Neck:     Thyroid: No thyromegaly.  Cardiovascular:     Rate and Rhythm: Normal rate and  regular rhythm.     Heart sounds: No murmur heard.  No friction rub. No gallop.   Pulmonary:     Effort: Pulmonary effort is normal. No respiratory distress.     Breath sounds: Normal breath sounds. No wheezing.  Chest:     Breasts:        Right: No mass, skin change or tenderness.        Left: No mass, skin change or tenderness.  Abdominal:     General: Bowel sounds are normal. There is no distension.     Palpations: Abdomen is soft.     Tenderness: There is no abdominal tenderness. There is no rebound.  Musculoskeletal:        General: Normal range of motion.     Cervical back: Normal range of motion and neck supple.  Neurological:     Mental Status: She is alert and oriented to person, place, and time.      Cranial Nerves: No cranial nerve deficit.  Skin:    General: Skin is warm and dry.  Psychiatric:        Judgment: Judgment normal.  Vitals reviewed.     Assessment:  ANNUAL EXAM 1. Women's annual routine gynecological examination   2. Screening for cervical cancer   3. Screening for thyroid disorder   4. Screening for diabetes mellitus   5. Screening for cholesterol level   6. Encounter for vitamin deficiency screening   7. Family history of pancreatic cancer      Screening Plan:            1.  Cervical Screening-  Pap smear done today  2. Breast screening- Exam annually and mammogram>40 planned   3. Colonoscopy every 10 years, Hemoccult testing - after age 37  4. Labs Ordered today  5. Counseling for contraception: Discussed all options. COnsidering IUD.  Concerned about weight gain w Depo and complinace w pill/patch/ring.   ALSO ADDRESSED THIS VISIT: Family history of pancreatic cancer Discussed pros and cons of genetic testing - Other/Misc lab test   She presents with a significant personal and/or family history of pancreatic cancer (MGM). Details of which can be found in her medical/family history. She does not have a previously identified BRCA and Lynch syndrome mutation in her family. Due to her personal and/or family history of cancer she is a candidate for the [facility] test(s).    Risk for cancer, genetic susceptibility discussed.  Patient has requested gene testing.  Discussed BRCA as well as Lynch syndrome and other cancer risk assessments available based on her family history and personal history. Pros and cons of testing discussed.   Considering options based on [facility] Medicaid Referral for Case manager placed    F/U  Return in about 1 year (around 05/20/2021) for Annual.  Barnett Applebaum, MD, [facility], [facility] 05/20/2020  10:33 AM

## 2020-05-20 NOTE — Patient Instructions (Addendum)
Multigene Panel Testing for Cancer  What is cancer? Normal cells in the body grow, divide, and are replaced on a routine basis. Sometimes, cells divide abnormally and begin to grow out of control. These cells may form growths or tumors. Tumors can be benign (not cancer) or malignant (cancer). Benign tumors do not spread to other body tissues. Cancer tumors can invade and destroy nearby healthy tissues, bones, and organs. Cancer cells also can spread to other parts of the body and form new cancerous areas.  What causes cancer? Cancer is caused by many different factors. A few types of cancer are caused by changes in genes that can be passed from parent to child. Changes in genes are called mutations. Certain gene mutations are associated with family cancer syndromes. What are family cancer syndromes?  Family cancer syndromes are genetic conditions that increase the risk of certain types of cancer. They also are called hereditary or inherited cancer syndromes. Common family cancer syndromes include hereditary breast and ovarian cancer (HBOC) syndrome, Lynch syndrome, Li-Fraumeni syndrome, Cowden syndrome, and Peutz-Jeghers syndrome. What is genetic testing for cancer? Genetic testing for cancer looks for mutations in certain genes that are known to be linked to cancer. The results can help determine your risk of developing a disease like cancer or passing on a genetic disorder. What is multigene panel testing? Multigene panel testing is a type of genetic testing that looks for mutations in several genes at once. This is different from single-gene testing, which looks for a mutation in a specific gene. Single-gene testing is often used when there is already a known gene mutation in a family. For example, testing for BRCA mutations only looks for changes in BRCA1 and BRCA2 genes. Who should have genetic testing? You may consider genetic testing if your personal or family history shows that you have an  increased risk of cancer. Your obstetrician-gynecologist (ob-gyn) or other health care professional may ask you these and other questions: . Have you or any family members been diagnosed with cancer?  . If yes, which family members were diagnosed, with what types of cancer, and at what ages?  . Were you or any of your family members born with birth defects?  . Are you of Eastern or Central European Jewish ancestry? Depending on your answers, your ob-gyn or other health care professional may suggest that you talk about genetic testing with a genetic counselor or a physician who is an expert in genetics. You can choose to have genetic testing, or you can choose not to. Before you decide, you should have genetic counseling. What is genetic counseling? In genetic counseling, you will talk with a genetic counselor or physician expert about the following: . Your risk of getting a hereditary type of cancer  . Who in your family could potentially get tested  . How testing is done  . What the test results may mean  . What you may do depending on the results How is genetic testing done? Genetic testing typically is done from a blood sample or saliva sample. When is multigene panel testing recommended? Multigene panel testing may be useful if you . are at risk of a family cancer syndrome that has more than one gene associated with it  . have a personal or family history of cancer and single-gene testing has not found a mutation, or the result is uncertain What are the benefits of multigene panel testing? Multigene panel testing looks at multiple genes with one test. If a gene   mutation is found, multigene panel testing may . give you a better understanding of your cancer risk than single-gene testing  . help your health care team decide what cancer screenings you might need beyond routine screenings  . help you think about what you can do to prevent cancer What are the risks of multigene panel  testing? The risks of multigene panel testing may include the following: . Results can be complicated to interpret.  . Testing may find gene mutations that show a moderate or uncertain risk of cancer.  . It may be hard to know what you should do with your test results. You should talk with a genetic counselor or physician expert before and after genetic testing to learn what the results mean. If I have a gene mutation, should I tell my family? Having a gene mutation means you can pass the mutation to your children. Your siblings also may have the gene mutation. Although you do not have to tell your family members, sharing the information could be life-saving for them. With this information, your family members can decide whether to be tested and get cancer screenings at an early age. How can I prevent cancer if I test positive for a gene mutation? If you test positive for a gene mutation, you can discuss cancer screening and prevention options with your ob-gyn, genetic counselor, or other health care professional. It may be helpful to have earlier or more frequent cancer screening tests, which can find cancer at an early and more curable stage. Risk reduction steps like medication, surgery, and lifestyle changes also may be recommended. I'm concerned about discrimination based on genetic testing results. What should I know? Many people are concerned about possible employment discrimination or denial of insurance coverage based on genetic testing results. The Genetic Information Nondiscrimination Act of 2008 (GINA) makes it illegal for health insurers to require genetic testing results or use results to make decisions about coverage, rates, or preexisting conditions. GINA also makes it illegal for employers to discriminate against employees or applicants because of genetic information. GINA does not apply to life insurance, long-term care insurance, or disability insurance. What should I know about  direct-to-consumer genetic tests? Direct-to-consumer (or at-home) genetic tests are sold over the internet. You do not need a doctor's order to get one. The [employer] discourages use of direct-to-consumer genetic tests because the results may be misleading. For example, one commercial test for BRCA mutations only looks for three mutations, even though there are more than 500 BRCA mutations linked to cancer. The test results could cause unnecessary fear, or a false sense that you are not at risk. You should see a health care professional if you want a genetic test.  Glossary BRCA1 and BRCA2: Genes that keep cells from growing too rapidly. Changes in these genes have been linked to an increased risk of breast cancer and ovarian cancer.  Cowden Syndrome: A genetic condition that increases a person's risk of cancer of the breast, thyroid, uterus, colon, kidney, and skin. Genes: Segments of DNA that contain instructions for the development of a person's physical traits and control of the processes in the body. The gene is the basic unit of heredity and can be passed from parent to child. Genetic Counselor: A health care professional with special training in genetics who can provide expert advice about genetic disorders and prenatal testing. Hereditary Breast and Ovarian Cancer (HBOC) Syndrome: A genetic condition that increases a person's risk of cancer  of the breast, ovary, prostate, pancreas, and skin (melanoma). Li-Fraumeni Syndrome: A genetic condition that increases a person's risk of cancer of the breast, bones, soft tissue, brain, and outer layer of the adrenal glands. Lynch Syndrome: A genetic condition that increases a person's risk of cancer of the colon, rectum, ovary, uterus, pancreas, and bile duct. Multigene Panel Testing: A type of genetic test that can look for mutations in multiple genes at once. Mutations: Changes in genes that can be passed from  parent to child. Obstetrician-Gynecologist (Ob-Gyn): A doctor with special training and education in women's health. Peutz-Jeghers Syndrome: A genetic condition that increases a person's risk of cancer of the stomach, intestines, pancreas, cervix, ovary, and breast.  Levonorgestrel intrauterine device (IUD) What is this medicine? LEVONORGESTREL IUD (LEE voe nor jes trel) is a contraceptive (birth control) device. The device is placed inside the uterus by a healthcare professional. It is used to prevent pregnancy. This device can also be used to treat heavy bleeding that occurs during your period. This medicine may be used for other purposes; ask your health care provider or pharmacist if you have questions. COMMON BRAND NAME(S): Minette Headland What should I tell my health care provider before I take this medicine? They need to know if you have any of these conditions:  abnormal Pap smear  cancer of the breast, uterus, or cervix  diabetes  endometritis  genital or pelvic infection now or in the past  have more than one sexual partner or your partner has more than one partner  heart disease  history of an ectopic or tubal pregnancy  immune system problems  IUD in place  liver disease or tumor  problems with blood clots or take blood-thinners  seizures  use intravenous drugs  uterus of unusual shape  vaginal bleeding that has not been explained  an unusual or allergic reaction to levonorgestrel, other hormones, silicone, or polyethylene, medicines, foods, dyes, or preservatives  pregnant or trying to get pregnant  breast-feeding How should I use this medicine? This device is placed inside the uterus by a health care professional. Talk to your pediatrician regarding the use of this medicine in children. Special care may be needed. Overdosage: If you think you have taken too much of this medicine contact a poison control center or emergency room at  once. NOTE: This medicine is only for you. Do not share this medicine with others. What if I miss a dose? This does not apply. Depending on the brand of device you have inserted, the device will need to be replaced every 3 to 6 years if you wish to continue using this type of birth control. What may interact with this medicine? Do not take this medicine with any of the following medications:  amprenavir  bosentan  fosamprenavir This medicine may also interact with the following medications:  aprepitant  armodafinil  barbiturate medicines for inducing sleep or treating seizures  bexarotene  boceprevir  griseofulvin  medicines to treat seizures like carbamazepine, ethotoin, felbamate, oxcarbazepine, phenytoin, topiramate  modafinil  pioglitazone  rifabutin  rifampin  rifapentine  some medicines to treat HIV infection like atazanavir, efavirenz, indinavir, lopinavir, nelfinavir, tipranavir, ritonavir  St. John's wort  warfarin This list may not describe all possible interactions. Give your health care provider a list of all the medicines, herbs, non-prescription drugs, or dietary supplements you use. Also tell them if you smoke, drink alcohol, or use illegal drugs. Some items may interact with your medicine. What should  I watch for while using this medicine? Visit your doctor or health care professional for regular check ups. See your doctor if you or your partner has sexual contact with others, becomes HIV positive, or gets a sexual transmitted disease. This product does not protect you against HIV infection (AIDS) or other sexually transmitted diseases. You can check the placement of the IUD yourself by reaching up to the top of your vagina with clean fingers to feel the threads. Do not pull on the threads. It is a good habit to check placement after each menstrual period. Call your doctor right away if you feel more of the IUD than just the threads or if you cannot feel  the threads at all. The IUD may come out by itself. You may become pregnant if the device comes out. If you notice that the IUD has come out use a backup birth control method like condoms and call your health care provider. Using tampons will not change the position of the IUD and are okay to use during your period. This IUD can be safely scanned with magnetic resonance imaging (MRI) only under specific conditions. Before you have an MRI, tell your healthcare provider that you have an IUD in place, and which type of IUD you have in place. What side effects may I notice from receiving this medicine? Side effects that you should report to your doctor or health care professional as soon as possible:  allergic reactions like skin rash, itching or hives, swelling of the face, lips, or tongue  fever, flu-like symptoms  genital sores  high blood pressure  no menstrual period for 6 weeks during use  pain, swelling, warmth in the leg  pelvic pain or tenderness  severe or sudden headache  signs of pregnancy  stomach cramping  sudden shortness of breath  trouble with balance, talking, or walking  unusual vaginal bleeding, discharge  yellowing of the eyes or skin Side effects that usually do not require medical attention (report to your doctor or health care professional if they continue or are bothersome):  acne  breast pain  change in sex drive or performance  changes in weight  cramping, dizziness, or faintness while the device is being inserted  headache  irregular menstrual bleeding within first 3 to 6 months of use  nausea This list may not describe all possible side effects. Call your doctor for medical advice about side effects. You may report side effects to FDA at 1-800-FDA-1088. Where should I keep my medicine? This does not apply. NOTE: This sheet is a summary. It may not cover all possible information. If you have questions about this medicine, talk to your  doctor, pharmacist, or health care provider.  2020 Elsevier/Gold Standard (2018-09-17 13:22:01)

## 2020-05-21 LAB — CYTOLOGY - PAP
Comment: NEGATIVE
Diagnosis: NEGATIVE
High risk HPV: NEGATIVE

## 2020-05-31 ENCOUNTER — Other Ambulatory Visit: Payer: Self-pay | Admitting: *Deleted

## 2020-05-31 NOTE — Patient Outreach (Signed)
   05/31/2020  Joann Clark [birth year] [id number]  Joann Clark is an 34 y.o. female  Subjective:   Objective:    Encounter Medications:   No outpatient encounter medications on file as of 05/31/2020.   No facility-administered encounter medications on file as of 05/31/2020.    Goals Addressed   None     SDOH Screenings   Alcohol Screen:   . Last Alcohol Screening Score (AUDIT):   Depression (PHQ2-9):   . PHQ-2 Score:   Financial Resource Strain:   . Difficulty of Paying Living Expenses:   Food Insecurity:   . Worried About Programme researcher, broadcasting/film/video in the Last Year:   . [employer] of Food in the Last Year:   Housing:   . Last Housing Risk Score:   Physical Activity:   . Days of Exercise per Week:   . Minutes of Exercise per Session:   Social Connections:   . Frequency of Communication with Friends and Family:   . Frequency of Social Gatherings with Friends and Family:   . Attends Religious Services:   . Active Member of Clubs or Organizations:   . Attends Banker Meetings:   Marland Kitchen Marital Status:   Stress:   . Feeling of Stress :   Tobacco Use: Medium Risk  . Smoking Tobacco Use: Former Smoker  . Smokeless Tobacco Use: Never Used  Transportation Needs:   . Freight forwarder (Medical):   Marland Kitchen Lack of Transportation (Non-Medical):    Assessment:    Received notification that patient is not eligible for program  Plan:   Will not open case at this time  Caryl Pina, RN, BSN, CCRN [facility] Care Management  [phone]

## 2020-05-31 NOTE — Patient Outreach (Deleted)
[facility] Customer service manager [facility]) Care Management [facility] CM Telephone Outreach, case closure  05/31/2020  Joann Clark March 09, 1986 [id number]

## 2020-11-20 NOTE — L&D Delivery Note (Addendum)
Delivery Note Primary OB: [facility] Delivery Physician: Annamarie Major, MD Gestational Age: Full term Antepartum complications: none Intrapartum complications: None  A viable Female was delivered via vertex presentation.  Apgars:8 ,8 Weight:  7 lbs 13 oz .   Placenta status: spontaneous and Intact.  Cord: 3+ vessels;  with the following complications: none.  Anesthesia:  none Episiotomy:  none Lacerations:  none Suture Repair: none Est. Blood Loss (mL):  less than 100 mL  Mom to postpartum.  Baby to Couplet care / Skin to Skin.  Annamarie Major, MD, [facility], [facility] 11/15/2021  2:02 PM [phone]

## 2021-03-24 ENCOUNTER — Other Ambulatory Visit: Payer: Self-pay | Admitting: Maternal & Fetal Medicine

## 2021-03-24 ENCOUNTER — Ambulatory Visit (HOSPITAL_BASED_OUTPATIENT_CLINIC_OR_DEPARTMENT_OTHER): Payer: Medicaid Other

## 2021-03-24 ENCOUNTER — Other Ambulatory Visit: Payer: Self-pay

## 2021-03-24 ENCOUNTER — Ambulatory Visit: Payer: Medicaid Other | Attending: Maternal & Fetal Medicine

## 2021-03-24 DIAGNOSIS — F1010 Alcohol abuse, uncomplicated: Secondary | ICD-10-CM

## 2021-03-24 DIAGNOSIS — O09521 Supervision of elderly multigravida, first trimester: Secondary | ICD-10-CM | POA: Insufficient documentation

## 2021-03-24 DIAGNOSIS — Z3A01 Less than 8 weeks gestation of pregnancy: Secondary | ICD-10-CM

## 2021-03-24 DIAGNOSIS — O99311 Alcohol use complicating pregnancy, first trimester: Secondary | ICD-10-CM | POA: Insufficient documentation

## 2021-03-24 NOTE — Progress Notes (Signed)
Referring Provider:  [facility] Length of Consultation: 40 minutes  Ms. Flippen was referred to Maternal Fetal Care at [facility] for genetic counseling because of advanced maternal age.  The patient will be 35 years old at the time of delivery.  This note summarizes the information we discussed.    We explained that the chance of a chromosome abnormality increases with maternal age.  Chromosomes and examples of chromosome problems were reviewed.  Humans typically have 46 chromosomes in each cell, with half passed through each sperm and egg.  Any change in the number or structure of chromosomes can increase the risk of problems in the physical and mental development of a pregnancy.   Based upon age of the patient and the current gestational age, the chance of any chromosome abnormality was 1 in 57. The chance of Down syndrome, the most common chromosome problem associated with maternal age, was 1 in 37.  The risk of chromosome problems is in addition to the 3% general population risk for birth defects and intellectual disabilities.  The greatest chance, of course, is that the baby would be born in good health.  We discussed the following prenatal screening and testing options for this pregnancy:  Cell free fetal DNA testing analyzes maternal blood to determine whether or not the baby may have Down syndrome, trisomy 78, or trisomy 25.  This test utilizes a maternal blood sample and DNA sequencing technology to isolate circulating cell free fetal DNA from maternal plasma.  The fetal DNA can then be analyzed for DNA sequences that are derived from the three most common chromosomes involved in aneuploidy, chromosomes 13, 18, and 21.  If the overall amount of DNA is greater than the expected level for any of these chromosomes, aneuploidy is suspected.  While we do not consider it a replacement for invasive testing and karyotype analysis, a negative result from this testing  would be reassuring, though not a guarantee of a normal chromosome complement for the baby.  An abnormal result is certainly suggestive of an abnormal chromosome complement, though we would still recommend CVS or amniocentesis to confirm any findings from this testing.  The chorionic villus sampling procedure is available for first trimester chromosome analysis.  This involves the withdrawal of a small amount of chorionic villi (tissue from the developing placenta).  Risk of pregnancy loss is estimated to be approximately 1 in 200 to 1 in 100 (0.5 to 1%).  There is approximately a 1% (1 in 100) chance that the CVS chromosome results will be unclear.  Chorionic villi cannot be tested for neural tube defects.     Maternal serum marker screening, a blood test that measures pregnancy proteins, can provide risk assessments for Down syndrome, trisomy 18, and open neural tube defects (spina bifida, anencephaly). Because it does not directly examine the fetus, it cannot positively diagnose or rule out these problems. The detection rate is approximately 75% for Down syndrome, 70% for trisomy 18 and 80% of open neural tube defects. If prior chromosome screening has been performed, then AFP only is recommended to test for open neural tube defects alone.  Targeted ultrasound uses high frequency sound waves to create an image of the developing fetus.  An ultrasound is often recommended as a routine means of evaluating the pregnancy.  It is also used to screen for fetal anatomy problems (for example, a heart defect) that might be suggestive of a chromosomal or other abnormality.   Amniocentesis involves the  removal of a small amount of amniotic fluid from the sac surrounding the fetus with the use of a thin needle inserted through the maternal abdomen and uterus.  Ultrasound guidance is used throughout the procedure.  Fetal cells from amniotic fluid are directly evaluated and > 99.5% of chromosome problems and > 98% of  open neural tube defects can be detected. This procedure is generally performed after the 15th week of pregnancy.  The main risks to this procedure include complications leading to miscarriage in less than 1 in 200 cases (0.5%).  Cystic Fibrosis and Spinal Muscular Atrophy (SMA) screening were also discussed with the patient. Both conditions are recessive, which means that both parents must be carriers in order to have a child with the disease.  Cystic fibrosis (CF) is one of the most common genetic conditions in persons of Caucasian ancestry.  This condition occurs in approximately 1 in 2,500 Caucasian persons and results in thickened secretions in the lungs, digestive, and reproductive systems.  For a baby to be at risk for having CF, both of the parents must be carriers for this condition.  Approximately 1 in 36 Caucasian persons is a carrier for CF.  Current carrier testing looks for the most common mutations in the gene for CF and can detect approximately 90% of carriers in the Caucasian population.  This means that the carrier screening can greatly reduce, but cannot eliminate, the chance for an individual to have a child with CF.  If an individual is found to be a carrier for CF, then carrier testing would be available for the partner. As part of [place]'s newborn screening profile, all babies born in the state of [place] will have a two-tier screening process.  Specimens are first tested to determine the concentration of immunoreactive trypsinogen (IRT).  The top 5% of specimens with the highest IRT values then undergo DNA testing using a panel of over 40 common CF mutations. SMA is a neurodegenerative disorder that leads to atrophy of skeletal muscle and overall weakness.  This condition is also more prevalent in the Caucasian population, with 1 in 40-1 in 60 persons being a carrier and 1 in 6,000-1 in 10,000 children being affected.  There are multiple forms of the disease, with some  causing death in infancy to other forms with survival into adulthood.  The genetics of SMA is complex, but carrier screening can detect up to 95% of carriers in the Caucasian population.  Similar to CF, a negative result can greatly reduce, but cannot eliminate, the chance to have a child with SMA.  Hemoglobinopathy screening was previously documented as negative.  We reviewed the detailed family history and pregnancy history obtained at her genetic counseling visit with [facility] in 2016.  The patient stated that this is her fifth pregnancy, the third with her current partner.  She has two healthy daughters, ages 17 and 53, from a prior relationship and two daughters, ages 61 and 5 years with her current partner.  She reported bipolar disorder and schizophrenia in two of her brothers as well as her sisters son.  We reviewed that mental health conditions are known to have strong genetic components in many families, but at this time, the specific genetic causes are not well understood.  Mental health disorders are most likely the result of a combination of genetic as well as lifestyle or environmental factors. Ms. Ginty also reported a history of hypertension and Crohns disease in her mother.  High blood pressure  is known to be the result of both inherited and lifestyle factors. Data on Crohns and other inflammatory bowel disease has shown approximately a 3-5% chance for recurrence in first degree relatives, which may be due to a combination of multiple genetic factors as well as other components. Lastly, she had previously reported a maternal first cousin once removed with Down syndrome. We reviewed in detail screening and testing for chromosome conditions and would expect a low chance for recurrence given the distance of this relative to the current pregnancy as well as the lack of other family history suggestive of an inherited chromosome rearrangement. The remainder of the family history is unremarkable  for birth defects, developmental delays, recurrent pregnancy loss or known chromosome abnormalities.  Ms. 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  She learned that she was pregnant on 03/18/21, just after her missed period and stopped alcohol use at that time  Prior to that, she was drinking approximately 4 shots 3-5 times per week.  Alcohol consumption during pregnancy has been associated with a number of birth defects including growth delays, small head size, heart defects, eye anomalies and facial differences as well as learning disabilities and behavioral problems.  The risk of these to occur tends to increase with the amount of alcohol consumed, however, malformations have been seen with as little as two drinks per day.  Because there is no safe amount of alcohol consumption during pregnancy, we suggest women completely avoid alcohol while pregnant.  A level 2 ultrasound and fetal echocardiogram (a detailed ultrasound of the fetal heart after 20 weeks) may help to detect growth delays or birth defects associated with alcohol use.  Of note, most of the exposure in this pregnancy occurred prior to [redacted] weeks gestation and was therefore in the all or none period, which would greatly reduce the chance for fetal effects from this exposure.  She reported no other complications or exposures to medications, recreational drugs or tobacco.  After consideration of the options, Ms. Luo elected to proceed with an ultrasound today. She declined carrier screening for CF and SMA.  She will return to our clinic in at [redacted] weeks gestation for first trimester ultrasound and cell free fetal DNA screening.  An ultrasound was performed at the time of the visit.  The gestational age was consistent with 5weeks, 5 days.  Fetal anatomy could not be assessed due to early gestational age.  Please refer to the ultrasound report for details of that study.  Ms. Albritton was encouraged to call with questions or concerns.   We can be contacted at [phone].  Plan of care:  Return to clinic for first trimester anatomy ultrasound at [redacted] weeks gestation.  Proceed with cell free fetal DNA testing at that time.  Consider invasive prenatal diagnosis with CVS or amniocentesis if desired.  Consider options regarding whether to continue this pregnancy based upon personal and medical considerations.   Cherly Anderson, MS, CGC

## 2021-04-21 DIAGNOSIS — O09521 Supervision of elderly multigravida, first trimester: Secondary | ICD-10-CM | POA: Diagnosis not present

## 2021-04-21 DIAGNOSIS — Z1389 Encounter for screening for other disorder: Secondary | ICD-10-CM | POA: Diagnosis not present

## 2021-05-11 ENCOUNTER — Other Ambulatory Visit: Payer: Self-pay | Admitting: Obstetrics and Gynecology

## 2021-05-11 DIAGNOSIS — O99312 Alcohol use complicating pregnancy, second trimester: Secondary | ICD-10-CM

## 2021-05-11 DIAGNOSIS — O09522 Supervision of elderly multigravida, second trimester: Secondary | ICD-10-CM

## 2021-05-12 ENCOUNTER — Ambulatory Visit: Payer: Medicaid Other | Attending: Maternal & Fetal Medicine

## 2021-05-12 ENCOUNTER — Other Ambulatory Visit: Payer: Self-pay | Admitting: Obstetrics and Gynecology

## 2021-05-12 ENCOUNTER — Other Ambulatory Visit: Payer: Self-pay

## 2021-05-12 DIAGNOSIS — O09522 Supervision of elderly multigravida, second trimester: Secondary | ICD-10-CM | POA: Diagnosis not present

## 2021-05-12 DIAGNOSIS — Z3A12 12 weeks gestation of pregnancy: Secondary | ICD-10-CM | POA: Insufficient documentation

## 2021-05-12 DIAGNOSIS — O09521 Supervision of elderly multigravida, first trimester: Secondary | ICD-10-CM

## 2021-05-12 DIAGNOSIS — O99312 Alcohol use complicating pregnancy, second trimester: Secondary | ICD-10-CM

## 2021-05-24 ENCOUNTER — Other Ambulatory Visit: Payer: Self-pay

## 2021-05-24 ENCOUNTER — Ambulatory Visit (INDEPENDENT_AMBULATORY_CARE_PROVIDER_SITE_OTHER): Payer: Medicaid Other | Admitting: Obstetrics

## 2021-05-24 ENCOUNTER — Encounter: Payer: Self-pay | Admitting: Obstetrics

## 2021-05-24 VITALS — BP 102/62 | HR 95 | Wt 216.0 lb

## 2021-05-24 DIAGNOSIS — O0990 Supervision of high risk pregnancy, unspecified, unspecified trimester: Secondary | ICD-10-CM | POA: Insufficient documentation

## 2021-05-24 DIAGNOSIS — Z113 Encounter for screening for infections with a predominantly sexual mode of transmission: Secondary | ICD-10-CM

## 2021-05-24 DIAGNOSIS — Z3A13 13 weeks gestation of pregnancy: Secondary | ICD-10-CM

## 2021-05-24 DIAGNOSIS — Z1379 Encounter for other screening for genetic and chromosomal anomalies: Secondary | ICD-10-CM | POA: Diagnosis not present

## 2021-05-24 DIAGNOSIS — Z3480 Encounter for supervision of other normal pregnancy, unspecified trimester: Secondary | ICD-10-CM

## 2021-05-24 DIAGNOSIS — Z124 Encounter for screening for malignant neoplasm of cervix: Secondary | ICD-10-CM

## 2021-05-24 LAB — POCT URINALYSIS DIPSTICK OB
Appearance: NORMAL
Bilirubin, UA: NEGATIVE
Blood, UA: NEGATIVE
Glucose, UA: NEGATIVE
Ketones, UA: NEGATIVE
Leukocytes, UA: NEGATIVE
Nitrite, UA: NEGATIVE
Odor: NORMAL
POC,PROTEIN,UA: NEGATIVE
Spec Grav, UA: 1.02 (ref 1.010–1.025)
Urobilinogen, UA: 0.2 E.U./dL
pH, UA: 5 (ref 5.0–8.0)

## 2021-05-24 NOTE — Progress Notes (Signed)
No complaints

## 2021-05-24 NOTE — Progress Notes (Signed)
New Obstetric Patient H&P    Chief Complaint: "Desires prenatal care"   History of Present Illness: Patient is a 35 y.o. [id number] Not Hispanic or Latino female, LMP 02/16/2021 presents with amenorrhea and positive home pregnancy test. Based on her  LMP, her EDD is Estimated Date of Delivery: 11/23/21 and her EGA is [redacted]w[redacted]d. Cycles are 5. days, regular, and occur approximately every : 28 days. Her last pap smear was about 1 years ago and was no abnormalities.    She had a urine pregnancy test which was positive about 3 week(s)  ago. Her last menstrual period was normal and lasted for  about 5 day(s). Since her LMP she claims she has experienced fatigue and nausea.. She denies vaginal bleeding. Her past medical history is noncontributory. Her prior pregnancies are notable for none  Since her LMP, she admits to the use of tobacco products  no She claims she has gained   15 pounds since the start of her pregnancy.  There are cats in the home in the home  no  She admits close contact with children on a regular basis  yes  She has had chicken pox in the past no She has had Tuberculosis exposures, symptoms, or previously tested positive for TB   no Current or past history of domestic violence. no  Genetic Screening/Teratology Counseling: (Includes patient, baby's father, or anyone in either family with:)   1. Patient's age >/= 87 at [facility]  yes 2. Thalassemia ([place], [place], Mediterranean, or Asian background): MCV<80  no 3. Neural tube defect (meningomyelocele, spina bifida, anencephaly)  no 4. Congenital heart defect  no  5. Down syndrome  no 6. Tay-Sachs (Jewish, [place]  no 7. Canavan's Disease  no 8. Sickle cell disease or trait (African)  no  9. Hemophilia or other blood disorders  no  10. Muscular dystrophy  no  11. Cystic fibrosis  no  12. Huntington's Chorea  no  13. Mental retardation/autism  no 14. Other inherited genetic or chromosomal disorder  no 15. Maternal  metabolic disorder (DM, PKU, etc)  no 16. Patient or FOB with a child with a birth defect not listed above no  16a. Patient or FOB with a birth defect themselves no 17. Recurrent pregnancy loss, or stillbirth  no  18. Any medications since LMP other than prenatal vitamins (include vitamins, supplements, OTC meds, drugs, alcohol)  no 19. Any other genetic/environmental exposure to discuss  no  Infection History:   1. Lives with someone with TB or TB exposed  no  2. Patient or partner has history of genital herpes  no 3. Rash or viral illness since LMP  no 4. History of STI (GC, CT, HPV, syphilis, HIV)  Yes Chlamydia in the past. 5. History of recent travel :  no  Other pertinent information:  no     Review of Systems:10 point review of systems negative unless otherwise noted in HPI  Past Medical History:  Past Medical History:  Diagnosis Date  . Encounter for surveillance of injectable contraceptive 03/27/2018  . Medical history non-contributory   . Vaginal Pap smear with ASC-US     Past Surgical History:  No past surgical history on file.  Gynecologic History: Patient's last menstrual period was 02/16/2021.  Obstetric History: [id number]  Family History:  Family History  Problem Relation Age of Onset  . Hypertension Mother     Social History:  Social History   Socioeconomic History  . Marital  status: Single    Spouse name: Not on file  . Number of children: Not on file  . Years of education: Not on file  . Highest education level: Not on file  Occupational History  . Not on file  Tobacco Use  . Smoking status: Former    Pack years: 0.00  . Smokeless tobacco: Never  Vaping Use  . Vaping Use: Never used  Substance and Sexual Activity  . Alcohol use: Yes  . Drug use: No  . Sexual activity: Yes    Birth control/protection: Injection  Other Topics Concern  . Not on file  Social History Narrative  . Not on file   Social Determinants of Health   Financial  Resource Strain: Not on file  Food Insecurity: Not on file  Transportation Needs: Not on file  Physical Activity: Not on file  Stress: Not on file  Social Connections: Not on file  Intimate Partner Violence: Not on file    Allergies:  No Known Allergies  Medications: Prior to Admission medications   Medication Sig Start Date End Date Taking? Authorizing Provider  Prenatal Vit-Fe Fumarate-FA (PRENATAL MULTIVITAMIN) TABS tablet Take 1 tablet by mouth daily at 12 noon.   Yes [provider]    Physical Exam Vitals: Blood pressure 102/62, pulse 95, weight 216 lb (98 kg), last menstrual period 02/16/2021, unknown if currently breastfeeding.  General: NAD HEENT: normocephalic, anicteric Thyroid: no enlargement, no palpable nodules Pulmonary: No increased work of breathing, CTAB Cardiovascular: RRR, distal pulses 2+ Abdomen: NABS, soft, non-tender, non-distended.  Umbilicus without lesions.  No hepatomegaly, splenomegaly or masses palpable. No evidence of hernia  Genitourinary:  External: Normal external female genitalia.  Normal urethral meatus, normal  Bartholin's and Skene's glands.    Vagina: Normal vaginal mucosa, no evidence of prolapse.    Cervix: Grossly normal in appearance, no bleeding  Uterus: anteverted, Non-enlarged, mobile, normal contour.  No CMT. + FHTS today  Adnexa: ovaries non-enlarged, no adnexal masses  Rectal: deferred Extremities: no edema, erythema, or tenderness Neurologic: Grossly intact Psychiatric: mood appropriate, affect full   Assessment: 35 y.o. [id number] at [redacted]w[redacted]d presenting to initiate prenatal care  Plan: 1) Avoid alcoholic beverages. 2) Patient encouraged not to smoke.  3) Discontinue the use of all non-medicinal drugs and chemicals.  4) Take prenatal vitamins daily.  5) Nutrition, food safety (fish, cheese advisories, and high nitrite foods) and exercise discussed. 6) Hospital and practice style discussed with cross coverage system.   7) Genetic Screening, such as with 1st Trimester Screening, cell free fetal DNA, AFP testing, and Ultrasound, as well as with amniocentesis and CVS as appropriate, is discussed with patient. At the conclusion of today's visit patient requested genetic testing 8) Patient is asked about travel to areas at risk for the Zika virus, and counseled to avoid travel and exposure to mosquitoes or sexual partners who may have themselves been exposed to the virus. Testing is discussed, and will be ordered as appropriate.  Bloodwork drawn today, plus MaternT, Pap smear and Aptima swab sent. RTC in 4 weeks. She already had a dating scan, and has her anatomy scan at the [facility].

## 2021-05-25 ENCOUNTER — Encounter: Payer: Self-pay | Admitting: Obstetrics

## 2021-05-25 DIAGNOSIS — O09529 Supervision of elderly multigravida, unspecified trimester: Secondary | ICD-10-CM | POA: Insufficient documentation

## 2021-05-25 LAB — RPR+RH+ABO+RUB AB+AB SCR+CB...
Antibody Screen: NEGATIVE
HIV Screen 4th Generation wRfx: NONREACTIVE
Hematocrit: 39.5 % (ref 34.0–46.6)
Hemoglobin: 14.4 g/dL (ref 11.1–15.9)
Hepatitis B Surface Ag: NEGATIVE
MCH: 32.7 pg (ref 26.6–33.0)
MCHC: 36.5 g/dL — ABNORMAL HIGH (ref 31.5–35.7)
MCV: 90 fL (ref 79–97)
Platelets: 255 10*3/uL (ref 150–450)
RBC: 4.4 x10E6/uL (ref 3.77–5.28)
RDW: 12.2 % (ref 11.7–15.4)
RPR Ser Ql: NONREACTIVE
Rh Factor: POSITIVE
Rubella Antibodies, IGG: 6.95 index (ref >0.99)
Varicella zoster IgG: <135 index — ABNORMAL LOW (ref >165)
WBC: 10.1 10*3/uL (ref 3.4–10.8)

## 2021-05-26 ENCOUNTER — Ambulatory Visit: Payer: Medicaid Other | Admitting: Obstetrics & Gynecology

## 2021-05-26 LAB — GC/CHLAMYDIA PROBE AMP
Chlamydia trachomatis, NAA: NEGATIVE
Neisseria Gonorrhoeae by PCR: NEGATIVE

## 2021-05-26 LAB — URINE CULTURE

## 2021-05-27 LAB — IGP, APTIMA HPV, RFX 16/18,45: HPV Aptima: NEGATIVE

## 2021-05-28 LAB — MATERNIT 21 PLUS CORE, BLOOD
Fetal Fraction: 4
Result (T21): NEGATIVE
Trisomy 13 (Patau syndrome): NEGATIVE
Trisomy 18 (Edwards syndrome): NEGATIVE
Trisomy 21 (Down syndrome): NEGATIVE

## 2021-05-31 LAB — URINE DRUG PANEL 7
Amphetamines, Urine: NEGATIVE ng/mL
Barbiturate Quant, Ur: NEGATIVE ng/mL
Benzodiazepine Quant, Ur: NEGATIVE ng/mL
Cannabinoid Quant, Ur: POSITIVE — AB
Cocaine (Metab.): NEGATIVE ng/mL
Opiate Quant, Ur: NEGATIVE ng/mL
PCP Quant, Ur: NEGATIVE ng/mL

## 2021-06-20 DIAGNOSIS — Z419 Encounter for procedure for purposes other than remedying health state, unspecified: Secondary | ICD-10-CM | POA: Diagnosis not present

## 2021-06-21 ENCOUNTER — Other Ambulatory Visit: Payer: Self-pay

## 2021-06-21 ENCOUNTER — Ambulatory Visit (INDEPENDENT_AMBULATORY_CARE_PROVIDER_SITE_OTHER): Payer: Medicaid Other | Admitting: Obstetrics

## 2021-06-21 VITALS — BP 118/68 | Wt 219.0 lb

## 2021-06-21 DIAGNOSIS — Z3A17 17 weeks gestation of pregnancy: Secondary | ICD-10-CM

## 2021-06-21 DIAGNOSIS — Z3480 Encounter for supervision of other normal pregnancy, unspecified trimester: Secondary | ICD-10-CM

## 2021-06-21 LAB — POCT URINALYSIS DIPSTICK OB
Glucose, UA: NEGATIVE
POC,PROTEIN,UA: NEGATIVE

## 2021-06-21 NOTE — Progress Notes (Signed)
  Routine Prenatal Care Visit  Subjective  Joann Clark is a 35 y.o. [id number] at [redacted]w[redacted]d being seen today for ongoing prenatal care.  She is currently monitored for the following issues for this low-risk pregnancy and has Vitamin D deficiency; Supervision of other normal pregnancy, antepartum; and Advanced maternal age in multigravida, unspecified trimester on their problem list.  ----------------------------------------------------------------------------------- Patient reports no complaints.    .  .   Pincus Large Fluid denies.  ----------------------------------------------------------------------------------- The following portions of the patient's history were reviewed and updated as appropriate: allergies, current medications, past family history, past medical history, past social history, past surgical history and problem list. Problem list updated.  Objective  Blood pressure 118/68, weight 219 lb (99.3 kg), last menstrual period 02/16/2021, unknown if currently breastfeeding. Pregravid weight 205 lb (93 kg) Total Weight Gain 14 lb (6.35 kg) Urinalysis: Urine Protein    Urine Glucose    Fetal Status:           General:  Alert, oriented and cooperative. Patient is in no acute distress.  Skin: Skin is warm and dry. No rash noted.   Cardiovascular: Normal heart rate noted  Respiratory: Normal respiratory effort, no problems with respiration noted  Abdomen: Soft, gravid, appropriate for gestational age.       Pelvic:  Cervical exam deferred        Extremities: Normal range of motion.     Mental Status: Normal mood and affect. Normal behavior. Normal judgment and thought content.   Assessment   35 y.o. [id number] at [redacted]w[redacted]d by  11/23/2021, by Other Basis presenting for routine prenatal visit  Plan   Pregnancy Problems (from 03/24/21 to present)    Problem Noted Resolved   Advanced maternal age in multigravida, unspecified trimester 05/25/2021 by Mirna Mires, CNM No   Supervision of  other normal pregnancy, antepartum 05/24/2021 by Mirna Mires, CNM No   Overview Addendum 05/31/2021 12:18 PM by Mirna Mires, CNM     Nursing Staff Provider  Office Location  [facility] Dating  Completed at MFM  Language   Anatomy US  Scheduled with MFM  Flu Vaccine   Genetic Screen  NIPS: MaternT- xx  TDaP vaccine    Hgb A1C or  GTT Early : Third trimester :   Covid    LAB RESULTS   Rhogam   NA Blood Type   B+  Feeding Plan  Breast Antibody  negative  Contraception  Rubella  immune, Varicella non immune  Circumcision  RPR   NR  Pediatrician   HBsAg   negative  Support Person  HIV  NR  Prenatal Classes  Varicella Non immune    GBS  (For PCN allergy, check sensitivities)   BTL Consent     VBAC Consent  Pap      Hgb Electro      CF      SMA          MJ use- UDS is positive for cannabinoids.          Preterm labor symptoms and general obstetric precautions including but not limited to vaginal bleeding, contractions, leaking of fluid and fetal movement were reviewed in detail with the patient. Please refer to After Visit Summary for other counseling recommendations.  Anatomy scan previously ordered for  ordered for2-3 weeks out.  Return in about 4 weeks (around 07/19/2021) for return OB.  Mirna Mires, CNM  06/21/2021 2:19 PM

## 2021-06-21 NOTE — Progress Notes (Signed)
ROB - no concerns. RM 4 °

## 2021-07-05 ENCOUNTER — Other Ambulatory Visit: Payer: Self-pay | Admitting: Maternal & Fetal Medicine

## 2021-07-05 DIAGNOSIS — O09522 Supervision of elderly multigravida, second trimester: Secondary | ICD-10-CM

## 2021-07-05 DIAGNOSIS — Z363 Encounter for antenatal screening for malformations: Secondary | ICD-10-CM

## 2021-07-07 ENCOUNTER — Telehealth: Payer: Self-pay

## 2021-07-07 ENCOUNTER — Other Ambulatory Visit: Payer: Self-pay

## 2021-07-07 ENCOUNTER — Ambulatory Visit: Payer: Medicaid Other | Attending: Maternal & Fetal Medicine

## 2021-07-07 VITALS — BP 108/68 | HR 94 | Resp 18 | Ht 65.0 in | Wt 226.5 lb

## 2021-07-07 DIAGNOSIS — F1099 Alcohol use, unspecified with unspecified alcohol-induced disorder: Secondary | ICD-10-CM | POA: Insufficient documentation

## 2021-07-07 DIAGNOSIS — Z3A20 20 weeks gestation of pregnancy: Secondary | ICD-10-CM | POA: Insufficient documentation

## 2021-07-07 DIAGNOSIS — O99212 Obesity complicating pregnancy, second trimester: Secondary | ICD-10-CM | POA: Insufficient documentation

## 2021-07-07 DIAGNOSIS — O09522 Supervision of elderly multigravida, second trimester: Secondary | ICD-10-CM | POA: Insufficient documentation

## 2021-07-07 DIAGNOSIS — O99312 Alcohol use complicating pregnancy, second trimester: Secondary | ICD-10-CM | POA: Diagnosis not present

## 2021-07-07 DIAGNOSIS — Z3480 Encounter for supervision of other normal pregnancy, unspecified trimester: Secondary | ICD-10-CM

## 2021-07-07 DIAGNOSIS — E669 Obesity, unspecified: Secondary | ICD-10-CM

## 2021-07-07 DIAGNOSIS — Z363 Encounter for antenatal screening for malformations: Secondary | ICD-10-CM | POA: Diagnosis not present

## 2021-07-07 DIAGNOSIS — O09529 Supervision of elderly multigravida, unspecified trimester: Secondary | ICD-10-CM

## 2021-07-07 NOTE — Telephone Encounter (Signed)
Pt calling for refill of her pnv.  [phone]  Called pt to see what the name of her pnv is; she doesn't know; she never p/u from pharm and now pharm says they do not have rx.

## 2021-07-08 ENCOUNTER — Other Ambulatory Visit: Payer: Self-pay | Admitting: Obstetrics

## 2021-07-08 DIAGNOSIS — Z3480 Encounter for supervision of other normal pregnancy, unspecified trimester: Secondary | ICD-10-CM

## 2021-07-08 MED ORDER — PRENATAL MULTIVITAMIN CH: 1.0000 | ORAL_TABLET | Freq: Every day | ORAL | 10 refills | Status: DC

## 2021-07-08 NOTE — Telephone Encounter (Signed)
Pt aware.

## 2021-07-21 DIAGNOSIS — Z419 Encounter for procedure for purposes other than remedying health state, unspecified: Secondary | ICD-10-CM | POA: Diagnosis not present

## 2021-07-22 ENCOUNTER — Ambulatory Visit (INDEPENDENT_AMBULATORY_CARE_PROVIDER_SITE_OTHER): Payer: Medicaid Other | Admitting: Advanced Practice Midwife

## 2021-07-22 ENCOUNTER — Encounter: Payer: Self-pay | Admitting: Advanced Practice Midwife

## 2021-07-22 ENCOUNTER — Other Ambulatory Visit: Payer: Self-pay

## 2021-07-22 VITALS — BP 100/60 | Wt 232.0 lb

## 2021-07-22 DIAGNOSIS — Z3A22 22 weeks gestation of pregnancy: Secondary | ICD-10-CM

## 2021-07-22 DIAGNOSIS — O0990 Supervision of high risk pregnancy, unspecified, unspecified trimester: Secondary | ICD-10-CM

## 2021-07-22 DIAGNOSIS — O09529 Supervision of elderly multigravida, unspecified trimester: Secondary | ICD-10-CM

## 2021-07-22 NOTE — Progress Notes (Signed)
ROB- Declined flu shot, no concerns

## 2021-07-22 NOTE — Progress Notes (Signed)
  Routine Prenatal Care Visit  Subjective  Joann Clark is a 35 y.o. [id number] at [redacted]w[redacted]d being seen today for ongoing prenatal care.  She is currently monitored for the following issues for this low-risk pregnancy and has Vitamin D deficiency; Supervision of high risk pregnancy, antepartum; and Advanced maternal age in multigravida, unspecified trimester on their problem list.  ----------------------------------------------------------------------------------- Patient reports no complaints.  She had an MFM anatomy scan 2 weeks ago and has a follow up anatomy/growth with them 2 weeks from now.  . Vag. Bleeding: None.  Movement: Present. Leaking Fluid denies.  ----------------------------------------------------------------------------------- The following portions of the patient's history were reviewed and updated as appropriate: allergies, current medications, past family history, past medical history, past social history, past surgical history and problem list. Problem list updated.  Objective  Blood pressure 100/60, weight 232 lb (105.2 kg), last menstrual period 02/16/2021 Pregravid weight 205 lb (93 kg) Total Weight Gain 27 lb (12.2 kg) Urinalysis: Urine Protein    Urine Glucose    Fetal Status: Fetal Heart Rate (bpm): 147   Movement: Present     General:  Alert, oriented and cooperative. Patient is in no acute distress.  Skin: Skin is warm and dry. No rash noted.   Cardiovascular: Normal heart rate noted  Respiratory: Normal respiratory effort, no problems with respiration noted  Abdomen: Soft, gravid, appropriate for gestational age. Pain/Pressure: Absent     Pelvic:  Cervical exam deferred        Extremities: Normal range of motion.  Edema: None  Mental Status: Normal mood and affect. Normal behavior. Normal judgment and thought content.   Assessment   36 y.o. [id number] at [redacted]w[redacted]d by  11/23/2021, by Other Basis presenting for routine prenatal visit  Plan   Pregnancy Problems (from  03/24/21 to present)     Problem Noted Resolved   Advanced maternal age in multigravida, unspecified trimester 05/25/2021 by Mirna Mires, CNM No   Supervision of other normal pregnancy, antepartum 05/24/2021 by Mirna Mires, CNM No   Overview Addendum 07/22/2021  9:23 AM by Tresea Mall, CNM     Nursing Staff Provider  Office Location  [facility] Dating  Completed at MFM  Language   Anatomy US  Scheduled with MFM  Flu Vaccine   Genetic Screen  NIPS: MaternT- xx  TDaP vaccine    Hgb A1C or  GTT Early : Third trimester :   Covid    LAB RESULTS   Rhogam   NA Blood Type   B+  Feeding Plan  Breast Antibody  negative  Contraception Considering tubal- consent signed 07/22/21 Rubella  immune, Varicella non immune  Circumcision  RPR   NR  Pediatrician   HBsAg   negative  Support Person  HIV  NR  Prenatal Classes  Varicella Non immune    GBS  (For PCN allergy, check sensitivities)   BTL Consent     VBAC Consent  Pap      Hgb Electro      CF      SMA          MJ use- UDS is positive for cannabinoids.           Preterm labor symptoms and general obstetric precautions including but not limited to vaginal bleeding, contractions, leaking of fluid and fetal movement were reviewed in detail with the patient.   Return in about 4 weeks (around 08/19/2021) for rob.  Tresea Mall, CNM 07/22/2021 9:26 AM

## 2021-08-02 ENCOUNTER — Other Ambulatory Visit: Payer: Self-pay | Admitting: Maternal & Fetal Medicine

## 2021-08-02 DIAGNOSIS — O99212 Obesity complicating pregnancy, second trimester: Secondary | ICD-10-CM

## 2021-08-02 DIAGNOSIS — O09522 Supervision of elderly multigravida, second trimester: Secondary | ICD-10-CM

## 2021-08-04 ENCOUNTER — Other Ambulatory Visit: Payer: Self-pay

## 2021-08-04 ENCOUNTER — Ambulatory Visit: Payer: Medicaid Other | Attending: Maternal & Fetal Medicine

## 2021-08-04 DIAGNOSIS — O99312 Alcohol use complicating pregnancy, second trimester: Secondary | ICD-10-CM

## 2021-08-04 DIAGNOSIS — O09522 Supervision of elderly multigravida, second trimester: Secondary | ICD-10-CM

## 2021-08-04 DIAGNOSIS — Z3A00 Weeks of gestation of pregnancy not specified: Secondary | ICD-10-CM | POA: Insufficient documentation

## 2021-08-04 DIAGNOSIS — O99212 Obesity complicating pregnancy, second trimester: Secondary | ICD-10-CM

## 2021-08-04 DIAGNOSIS — Z3A24 24 weeks gestation of pregnancy: Secondary | ICD-10-CM | POA: Diagnosis not present

## 2021-08-04 DIAGNOSIS — E669 Obesity, unspecified: Secondary | ICD-10-CM

## 2021-08-04 DIAGNOSIS — F1099 Alcohol use, unspecified with unspecified alcohol-induced disorder: Secondary | ICD-10-CM

## 2021-08-19 ENCOUNTER — Encounter: Payer: Self-pay | Admitting: Advanced Practice Midwife

## 2021-08-19 ENCOUNTER — Other Ambulatory Visit: Payer: Self-pay

## 2021-08-19 ENCOUNTER — Ambulatory Visit (INDEPENDENT_AMBULATORY_CARE_PROVIDER_SITE_OTHER): Payer: Medicaid Other | Admitting: Advanced Practice Midwife

## 2021-08-19 VITALS — BP 110/68 | Wt 235.0 lb

## 2021-08-19 DIAGNOSIS — Z3A26 26 weeks gestation of pregnancy: Secondary | ICD-10-CM

## 2021-08-19 DIAGNOSIS — O0992 Supervision of high risk pregnancy, unspecified, second trimester: Secondary | ICD-10-CM

## 2021-08-19 DIAGNOSIS — Z130 Encounter for screening for diseases of the blood and blood-forming organs and certain disorders involving the immune mechanism: Secondary | ICD-10-CM

## 2021-08-19 DIAGNOSIS — Z369 Encounter for antenatal screening, unspecified: Secondary | ICD-10-CM

## 2021-08-19 DIAGNOSIS — Z131 Encounter for screening for diabetes mellitus: Secondary | ICD-10-CM

## 2021-08-19 DIAGNOSIS — O09529 Supervision of elderly multigravida, unspecified trimester: Secondary | ICD-10-CM

## 2021-08-19 DIAGNOSIS — Z113 Encounter for screening for infections with a predominantly sexual mode of transmission: Secondary | ICD-10-CM

## 2021-08-19 NOTE — Progress Notes (Signed)
ROB - no concerns. RM 6 

## 2021-08-19 NOTE — Progress Notes (Signed)
  Routine Prenatal Care Visit  Subjective  Joann Clark is a 35 y.o. [id number] at 110w2d being seen today for ongoing prenatal care.  She is currently monitored for the following issues for this high-risk pregnancy and has Vitamin D deficiency; Supervision of high risk pregnancy, antepartum; and Advanced maternal age in multigravida, unspecified trimester on their problem list.  ----------------------------------------------------------------------------------- Patient reports no complaints.   Contractions: Regular. Vag. Bleeding: None.  Movement: Present. Leaking Fluid denies.  ----------------------------------------------------------------------------------- The following portions of the patient's history were reviewed and updated as appropriate: allergies, current medications, past family history, past medical history, past social history, past surgical history and problem list. Problem list updated.  Objective  Blood pressure 110/68, weight 235 lb (106.6 kg), last menstrual period 02/16/2021, unknown if currently breastfeeding. Pregravid weight 205 lb (93 kg) Total Weight Gain 30 lb (13.6 kg) Urinalysis: Urine Protein    Urine Glucose    Fetal Status: Fetal Heart Rate (bpm): 143 Fundal Height: 27 cm Movement: Present     General:  Alert, oriented and cooperative. Patient is in no acute distress.  Skin: Skin is warm and dry. No rash noted.   Cardiovascular: Normal heart rate noted  Respiratory: Normal respiratory effort, no problems with respiration noted  Abdomen: Soft, gravid, appropriate for gestational age. Pain/Pressure: Absent     Pelvic:  Cervical exam deferred        Extremities: Normal range of motion.  Edema: None  Mental Status: Normal mood and affect. Normal behavior. Normal judgment and thought content.   Assessment   35 y.o. [id number] at [redacted]w[redacted]d by  11/23/2021, by Other Basis presenting for routine prenatal visit  Plan   Pregnancy Problems (from 03/24/21 to present)     Problem Noted Resolved   Advanced maternal age in multigravida, unspecified trimester 05/25/2021 by Mirna Mires, CNM No   Supervision of high risk pregnancy, antepartum 05/24/2021 by Mirna Mires, CNM No   Overview Addendum 07/22/2021  9:23 AM by Tresea Mall, CNM     Nursing Staff Provider  Office Location  [facility] Dating  Completed at MFM  Language   Anatomy US  Scheduled with MFM  Flu Vaccine   Genetic Screen  NIPS: MaternT- xx  TDaP vaccine    Hgb A1C or  GTT Early : Third trimester :   Covid    LAB RESULTS   Rhogam   NA Blood Type   B+  Feeding Plan  Breast Antibody  negative  Contraception Considering tubal- consent signed 07/22/21 Rubella  immune, Varicella non immune  Circumcision  RPR   NR  Pediatrician   HBsAg   negative  Support Person  HIV  NR  Prenatal Classes  Varicella Non immune    GBS  (For PCN allergy, check sensitivities)   BTL Consent     VBAC Consent  Pap      Hgb Electro      CF      SMA          MJ use- UDS is positive for cannabinoids.          Preterm labor symptoms and general obstetric precautions including but not limited to vaginal bleeding, contractions, leaking of fluid and fetal movement were reviewed in detail with the patient.   Return in about 2 weeks (around 09/02/2021) for 28w labs and rob.  Tresea Mall, CNM 08/19/2021 9:50 AM

## 2021-08-20 DIAGNOSIS — Z419 Encounter for procedure for purposes other than remedying health state, unspecified: Secondary | ICD-10-CM | POA: Diagnosis not present

## 2021-09-02 ENCOUNTER — Other Ambulatory Visit: Payer: Medicaid Other

## 2021-09-02 ENCOUNTER — Other Ambulatory Visit: Payer: Self-pay

## 2021-09-02 ENCOUNTER — Encounter: Payer: Self-pay | Admitting: Obstetrics & Gynecology

## 2021-09-02 ENCOUNTER — Ambulatory Visit (INDEPENDENT_AMBULATORY_CARE_PROVIDER_SITE_OTHER): Payer: Medicaid Other | Admitting: Obstetrics & Gynecology

## 2021-09-02 VITALS — BP 120/80 | Wt 237.0 lb

## 2021-09-02 DIAGNOSIS — Z113 Encounter for screening for infections with a predominantly sexual mode of transmission: Secondary | ICD-10-CM

## 2021-09-02 DIAGNOSIS — Z131 Encounter for screening for diabetes mellitus: Secondary | ICD-10-CM | POA: Diagnosis not present

## 2021-09-02 DIAGNOSIS — O99213 Obesity complicating pregnancy, third trimester: Secondary | ICD-10-CM

## 2021-09-02 DIAGNOSIS — O0992 Supervision of high risk pregnancy, unspecified, second trimester: Secondary | ICD-10-CM | POA: Diagnosis not present

## 2021-09-02 DIAGNOSIS — O0990 Supervision of high risk pregnancy, unspecified, unspecified trimester: Secondary | ICD-10-CM

## 2021-09-02 DIAGNOSIS — O09529 Supervision of elderly multigravida, unspecified trimester: Secondary | ICD-10-CM

## 2021-09-02 DIAGNOSIS — Z130 Encounter for screening for diseases of the blood and blood-forming organs and certain disorders involving the immune mechanism: Secondary | ICD-10-CM | POA: Diagnosis not present

## 2021-09-02 DIAGNOSIS — Z369 Encounter for antenatal screening, unspecified: Secondary | ICD-10-CM | POA: Diagnosis not present

## 2021-09-02 DIAGNOSIS — Z3A28 28 weeks gestation of pregnancy: Secondary | ICD-10-CM

## 2021-09-02 DIAGNOSIS — O0993 Supervision of high risk pregnancy, unspecified, third trimester: Secondary | ICD-10-CM

## 2021-09-02 NOTE — Progress Notes (Signed)
  Subjective  Fetal Movement? yes Contractions? no Leaking Fluid? no Vaginal Bleeding? no  Objective  BP 120/80   Wt 237 lb (107.5 kg)   LMP 02/16/2021   BMI 39.44 kg/m  General: NAD Pumonary: no increased work of breathing Abdomen: gravid, non-tender Extremities: no edema Psychiatric: mood appropriate, affect full  Assessment  35 y.o. [id number] at [redacted]w[redacted]d by  11/23/2021, by Other Basis presenting for routine prenatal visit  Plan   Problem List Items Addressed This Visit    Advanced maternal age in multigravida - NIPT nml XX - Plans PP BTL   Obesity affecting pregnancy in third trimester - Growth [place] monthly - APT after 37 weeks - ASA - IOL by 41 weeks   Supervision of high risk pregnancy in third trimester     -  PNV, [facility]   [redacted] weeks gestation of pregnancy - Glucola today          Problem Noted Resolved   Obesity affecting pregnancy in third trimester     Overview Signed 09/02/2021 10:17 AM by Nadara Mustard, MD    BMI >=35.0-39.9 [x ] u/s for dating [x ]  [x ] nutritional goals [x ] folic acid 1mg  [x ] bASA (>12 weeks) [ ]  consider nutrition consult [ ]  consider maternal EKG 1st trimester [x ] Growth u/s 66 [x ], 32 [ ] , 36 weeks [ ]  [x ] NST/AFI weekly 37+ weeks (37[] , 38[] , 39[] , 40[] ) [x ] IOL by 41 weeks (scheduled, prn [] )       Advanced maternal age in multigravida, unspecified trimester     Supervision of high risk pregnancy, antepartum     Overview Addendum 09/02/2021 10:17 AM by , MD     Nursing Staff Provider  Office Location  [facility] Dating  Completed at MFM  Language   English Anatomy 26  Scheduled with MFM  Flu Vaccine   Decline Genetic Screen  NIPS: MaternT- xx  TDaP vaccine    Hgb A1C or  GTT Third trimester :   Covid    LAB RESULTS   Rhogam   NA Blood Type   B+  Feeding Plan  Breast Antibody  negative  Contraception Considering tubal- consent signed 07/22/21 Rubella  immune, Varicella non immune  Circumcision n/a RPR   NR   Pediatrician   HBsAg   negative  Support Person parents HIV  NR  Prenatal Classes  Varicella Non immune    GBS  (For PCN allergy, check sensitivities)   BTL Consent done    VBAC Consent n/a Pap  05/2021    MJ use- UDS is positive for cannabinoids.          , MD, 09/04/2021 Ob/Gyn, [facility] 09/02/2021  10:18 AM

## 2021-09-02 NOTE — Patient Instructions (Signed)

## 2021-09-03 LAB — 28 WEEK RH+PANEL
Basophils Absolute: 0 10*3/uL (ref 0.0–0.2)
Basos: 0 %
EOS (ABSOLUTE): 0 10*3/uL (ref 0.0–0.4)
Eos: 0 %
Gestational Diabetes Screen: 126 mg/dL (ref 70–139)
HIV Screen 4th Generation wRfx: NONREACTIVE
Hematocrit: 36.4 % (ref 34.0–46.6)
Hemoglobin: 13 g/dL (ref 11.1–15.9)
Immature Grans (Abs): 0 10*3/uL (ref 0.0–0.1)
Immature Granulocytes: 1 %
Lymphocytes Absolute: 1.9 10*3/uL (ref 0.7–3.1)
Lymphs: 24 %
MCH: 32.2 pg (ref 26.6–33.0)
MCHC: 35.7 g/dL (ref 31.5–35.7)
MCV: 90 fL (ref 79–97)
Monocytes Absolute: 0.6 10*3/uL (ref 0.1–0.9)
Monocytes: 7 %
Neutrophils Absolute: 5.5 10*3/uL (ref 1.4–7.0)
Neutrophils: 68 %
Platelets: 264 10*3/uL (ref 150–450)
RBC: 4.04 x10E6/uL (ref 3.77–5.28)
RDW: 13.3 % (ref 11.7–15.4)
RPR Ser Ql: NONREACTIVE
WBC: 8.1 10*3/uL (ref 3.4–10.8)

## 2021-09-13 ENCOUNTER — Other Ambulatory Visit: Payer: Self-pay

## 2021-09-13 DIAGNOSIS — O0990 Supervision of high risk pregnancy, unspecified, unspecified trimester: Secondary | ICD-10-CM

## 2021-09-13 DIAGNOSIS — O09529 Supervision of elderly multigravida, unspecified trimester: Secondary | ICD-10-CM

## 2021-09-13 DIAGNOSIS — O99213 Obesity complicating pregnancy, third trimester: Secondary | ICD-10-CM

## 2021-09-15 ENCOUNTER — Other Ambulatory Visit: Payer: Self-pay

## 2021-09-15 ENCOUNTER — Ambulatory Visit: Payer: Medicaid Other | Attending: Maternal & Fetal Medicine

## 2021-09-15 DIAGNOSIS — O99313 Alcohol use complicating pregnancy, third trimester: Secondary | ICD-10-CM | POA: Insufficient documentation

## 2021-09-15 DIAGNOSIS — Z3A30 30 weeks gestation of pregnancy: Secondary | ICD-10-CM | POA: Insufficient documentation

## 2021-09-15 DIAGNOSIS — O0993 Supervision of high risk pregnancy, unspecified, third trimester: Secondary | ICD-10-CM

## 2021-09-15 DIAGNOSIS — O09523 Supervision of elderly multigravida, third trimester: Secondary | ICD-10-CM | POA: Insufficient documentation

## 2021-09-15 DIAGNOSIS — E669 Obesity, unspecified: Secondary | ICD-10-CM | POA: Diagnosis not present

## 2021-09-15 DIAGNOSIS — O09529 Supervision of elderly multigravida, unspecified trimester: Secondary | ICD-10-CM

## 2021-09-15 DIAGNOSIS — F1099 Alcohol use, unspecified with unspecified alcohol-induced disorder: Secondary | ICD-10-CM | POA: Insufficient documentation

## 2021-09-15 DIAGNOSIS — O99213 Obesity complicating pregnancy, third trimester: Secondary | ICD-10-CM | POA: Diagnosis not present

## 2021-09-15 DIAGNOSIS — O0990 Supervision of high risk pregnancy, unspecified, unspecified trimester: Secondary | ICD-10-CM

## 2021-09-16 ENCOUNTER — Encounter: Payer: Medicaid Other | Admitting: Advanced Practice Midwife

## 2021-09-16 ENCOUNTER — Encounter: Payer: Self-pay | Admitting: Advanced Practice Midwife

## 2021-09-16 ENCOUNTER — Ambulatory Visit (INDEPENDENT_AMBULATORY_CARE_PROVIDER_SITE_OTHER): Payer: Medicaid Other | Admitting: Advanced Practice Midwife

## 2021-09-16 VITALS — BP 110/60 | Wt 235.0 lb

## 2021-09-16 DIAGNOSIS — O0993 Supervision of high risk pregnancy, unspecified, third trimester: Secondary | ICD-10-CM

## 2021-09-16 DIAGNOSIS — O09529 Supervision of elderly multigravida, unspecified trimester: Secondary | ICD-10-CM

## 2021-09-16 DIAGNOSIS — Z3A30 30 weeks gestation of pregnancy: Secondary | ICD-10-CM

## 2021-09-16 DIAGNOSIS — O99213 Obesity complicating pregnancy, third trimester: Secondary | ICD-10-CM

## 2021-09-16 LAB — POCT URINALYSIS DIPSTICK OB
Glucose, UA: NEGATIVE
POC,PROTEIN,UA: NEGATIVE

## 2021-09-16 NOTE — Progress Notes (Signed)
ROB- no concerns 

## 2021-09-16 NOTE — Progress Notes (Signed)
Routine Prenatal Care Visit  Subjective  Joann Clark is a 35 y.o. [id number] at [redacted]w[redacted]d being seen today for ongoing prenatal care.  She is currently monitored for the following issues for this high-risk pregnancy and has Vitamin D deficiency; Supervision of high risk pregnancy, antepartum; Advanced maternal age in multigravida, unspecified trimester; and Obesity affecting pregnancy in third trimester on their problem list.  ----------------------------------------------------------------------------------- Patient reports no complaints.   Contractions: Not present. Vag. Bleeding: None.  Movement: Present. Leaking Fluid denies.  ----------------------------------------------------------------------------------- The following portions of the patient's history were reviewed and updated as appropriate: allergies, current medications, past family history, past medical history, past social history, past surgical history and problem list. Problem list updated.  Objective  Blood pressure 110/60, weight 235 lb (106.6 kg), last menstrual period 02/16/2021 Pregravid weight 205 lb (93 kg) Total Weight Gain 30 lb (13.6 kg) Urinalysis: Urine Protein    Urine Glucose    Fetal Status: Fetal Heart Rate (bpm): 139 Fundal Height: 33 cm Movement: Present      MFM Growth scan yesterday: 87%, 4 pounds, AFI wnl, cephalic  General:  Alert, oriented and cooperative. Patient is in no acute distress.  Skin: Skin is warm and dry. No rash noted.   Cardiovascular: Normal heart rate noted  Respiratory: Normal respiratory effort, no problems with respiration noted  Abdomen: Soft, gravid, appropriate for gestational age. Pain/Pressure: Absent     Pelvic:  Cervical exam deferred        Extremities: Normal range of motion.  Edema: None  Mental Status: Normal mood and affect. Normal behavior. Normal judgment and thought content.   Assessment   35 y.o. [id number] at [redacted]w[redacted]d by  11/23/2021, by Other Basis presenting for routine  prenatal visit  Plan   Pregnancy Problems (from 03/24/21 to present)    Problem Noted Resolved   Obesity affecting pregnancy in third trimester 09/02/2021 by Nadara Mustard, MD No   Overview Signed 09/02/2021 10:17 AM by Nadara Mustard, MD    BMI >=35.0-39.9 [x ] u/s for dating [x ]  [x ] nutritional goals [x ] folic acid 1mg  [x ] bASA (>12 weeks) [ ]  consider nutrition consult [ ]  consider maternal EKG 1st trimester [x ] Growth u/s 54 [x ], 32 [ ] , 36 weeks [ ]  [x ] NST/AFI weekly 37+ weeks (37[] , 38[] , 39[] , 40[] ) [x ] IOL by 41 weeks (scheduled, prn [] )       Advanced maternal age in multigravida, unspecified trimester 05/25/2021 by , CNM No   Supervision of high risk pregnancy, antepartum 05/24/2021 by , CNM No   Overview Addendum 09/16/2021  1:35 PM by , CNM     Nursing Staff Provider  Office Location  [facility] Dating  Completed at MFM  Language   English Anatomy 07/26/2021  Scheduled with MFM  Flu Vaccine   Decline Genetic Screen  NIPS: MaternT- xx  TDaP vaccine   Decline Hgb A1C or  GTT Third trimester :   Covid    LAB RESULTS   Rhogam   NA Blood Type   B+  Feeding Plan  Breast Antibody  negative  Contraception Considering tubal- consent signed 07/22/21 Rubella  immune, Varicella non immune  Circumcision n/a RPR   NR  Pediatrician   HBsAg   negative  Support Person parents HIV  NR  Prenatal Classes  Varicella Non immune    GBS  (For PCN allergy, check sensitivities)   BTL Consent done  VBAC Consent n/a Pap  05/2021    MJ use- UDS is positive for cannabinoids.          Preterm labor symptoms and general obstetric precautions including but not limited to vaginal bleeding, contractions, leaking of fluid and fetal movement were reviewed in detail with the patient.   Return in about 2 weeks (around 09/30/2021) for rob.  Tresea Mall, CNM 09/16/2021 1:47 PM

## 2021-09-16 NOTE — Addendum Note (Signed)
Addended by: Donnetta Hail on: 09/16/2021 01:56 PM   Modules accepted: Orders

## 2021-09-20 DIAGNOSIS — Z419 Encounter for procedure for purposes other than remedying health state, unspecified: Secondary | ICD-10-CM | POA: Diagnosis not present

## 2021-09-30 ENCOUNTER — Encounter: Payer: Self-pay | Admitting: Advanced Practice Midwife

## 2021-09-30 ENCOUNTER — Ambulatory Visit (INDEPENDENT_AMBULATORY_CARE_PROVIDER_SITE_OTHER): Payer: Medicaid Other | Admitting: Advanced Practice Midwife

## 2021-09-30 ENCOUNTER — Other Ambulatory Visit: Payer: Self-pay

## 2021-09-30 VITALS — BP 112/66 | Wt 235.0 lb

## 2021-09-30 DIAGNOSIS — O99213 Obesity complicating pregnancy, third trimester: Secondary | ICD-10-CM

## 2021-09-30 DIAGNOSIS — O0993 Supervision of high risk pregnancy, unspecified, third trimester: Secondary | ICD-10-CM

## 2021-09-30 DIAGNOSIS — O09529 Supervision of elderly multigravida, unspecified trimester: Secondary | ICD-10-CM

## 2021-09-30 NOTE — Patient Instructions (Signed)
Eating Plan for Pregnant Women °While you are pregnant, your body requires additional nutrition to help support your growing baby. You also have a higher need for some vitamins and minerals, such as folic acid, calcium, iron, and vitamin D. Eating a healthy, well-balanced diet is very important for your health and your baby's health. Your need for extra calories varies over the course of your pregnancy. Pregnancy is divided into three trimesters, with each trimester lasting 3 months. For most women, it is recommended to consume: °150 extra calories a day during the first trimester. °300 extra calories a day during the second trimester. °300 extra calories a day during the third trimester. °What are tips for following this plan? °Cooking °Practice good food safety and cleanliness. Wash your hands before you eat and after you prepare raw meat. Wash all fruits and vegetables well before peeling or eating. Taking these actions can help to prevent foodborne illnesses that can be very dangerous to your baby, such as listeriosis. Ask your health care provider for more information about listeriosis. °Make sure that all meats, poultry, and eggs are cooked to food-safe temperatures or "well-done." °Meal planning ° °Eat a variety of foods (especially fruits and vegetables) to get a full range of vitamins and minerals. °Two or more servings of fish are recommended each week in order to get the most benefits from omega-3 fatty acids that are found in seafood. Choose fish that are lower in mercury, such as salmon and pollock. °Limit your overall intake of foods that have "empty calories." These are foods that have little nutritional value, such as sweets, desserts, candies, and sugar-sweetened beverages. °Drinks that contain caffeine are okay to drink, but it is better to avoid caffeine. Keep your total caffeine intake to less than 200 mg each day (which is 12 oz or 355 mL of coffee, tea, or soda) or the limit as told by your  health care provider. °General information °Do not try to lose weight or go on a diet during pregnancy. °Take a prenatal vitamin to help meet your additional vitamin and mineral needs during pregnancy, specifically for folic acid, iron, calcium, and vitamin D. °Remember to stay active. Ask your health care provider what types of exercise and activities are safe for you. °What does 150 extra calories look like? °Healthy options that provide 150 extra calories each day could be any of the following: °6-8 oz (170-227 g) plain low-fat yogurt with ½ cup (70 g) berries. °1 apple with 2 tsp (11 g) peanut butter. °Cut-up vegetables with ¼ cup (60 g) hummus. °8 fl oz (237 mL) low-fat chocolate milk. °1 stick of string cheese with 1 medium orange. °1 peanut butter and jelly sandwich that is made with one slice of whole-wheat bread and 1 tsp (5 g) of peanut butter. °For 300 extra calories, you could eat two of these healthy options each day. °What is a healthy amount of weight to gain? °The right amount of weight gain for you is based on your BMI (body mass index) before you became pregnant. °If your BMI was less than 18 (underweight), you should gain 28-40 lb (13-18 kg). °If your BMI was 18-24.9 (normal), you should gain 25-35 lb (11-16 kg). °If your BMI was 25-29.9 (overweight), you should gain 15-25 lb (7-11 kg). °If your BMI was 30 or greater (obese), you should gain 11-20 lb (5-9 kg). °What if I am having twins or multiples? °Generally, if you are carrying twins or multiples: °You may need to eat   300-600 extra calories a day. °The recommended range for total weight gain is 25-54 lb (11-25 kg), depending on your BMI before pregnancy. °Talk with your health care provider to find out about nutritional needs, weight gain, and exercise that is right for you. °What foods should I eat? °Fruits °All fruits. Eat a variety of colors and types of fruit. Remember to wash your fruits well before peeling or eating. °Vegetables °All  vegetables. Eat a variety of colors and types of vegetables. Remember to wash your vegetables well before peeling or eating. °Grains °All grains. Choose whole grains, such as whole-wheat bread, oatmeal, or brown rice. °Meats and other protein foods °Lean meats, including chicken, turkey, and lean cuts of beef, veal, or pork. Fish that is higher in omega-3 fatty acids and lower in mercury, such as salmon, herring, mussels, trout, sardines, pollock, shrimp, crab, and lobster. Tofu. Tempeh. Beans. Eggs. Peanut butter and other nut butters. °Dairy °Pasteurized milk and milk alternatives, such as almond milk. Pasteurized yogurt and pasteurized cheese. Cottage cheese. Sour cream. °Beverages °Water. Juices that contain 100% fruit juice or vegetable juice. Caffeine-free teas and decaffeinated coffee. °Fats and oils °Fats and oils are okay to include in moderation. °Sweets and desserts °Sweets and desserts are okay to include in moderation. °Seasoning and other foods °All pasteurized condiments. °The items listed above may not be a complete list of foods and beverages you can eat. Contact a dietitian for more information. °What foods should I avoid? °Fruits °Raw (unpasteurized) fruit juices. °Vegetables °Unpasteurized vegetable juices. °Meats and other protein foods °Precooked or cured meat, such as bologna, hot dogs, sausages, or meat loaves. (If you must eat those meats, reheat them until they are steaming hot.) Refrigerated pate, meat spreads from a meat counter, or smoked seafood that is found in the refrigerated section of a store. Raw or undercooked meats, poultry, and eggs. Raw fish, such as sushi or sashimi. Fish that have high mercury content, such as tilefish, shark, swordfish, and king mackerel. °Dairy °Unpasteurized milk and any foods that have unpasteurized milk in them. Soft cheeses, such as feta, queso blanco, queso fresco, Brie, Camembert, panela, and blue-veined cheeses (unless they are made with pasteurized  milk, which must be stated on the label). °Beverages °Alcohol. Sugar-sweetened beverages, such as sodas, teas, or energy drinks. °Seasoning and other foods °Homemade fermented foods and drinks, such as pickles, sauerkraut, or kombucha drinks. (Store-bought pasteurized versions of these are okay.) °Salads that are made in a store or deli, such as ham salad, chicken salad, egg salad, tuna salad, and seafood salad. °The items listed above may not be a complete list of foods and beverages you should avoid. Contact a dietitian for more information. °Where to find more information °To calculate the number of calories you need based on your height, weight, and activity level, you can use an online calculator such as: °www.myplate.gov/myplate-plan °To calculate how much weight you should gain during pregnancy, you can use an online pregnancy weight gain calculator such as: °www.myplate.gov °To learn more about eating fish during pregnancy, talk with your health care provider or visit: °www.fda.gov °Summary °While you are pregnant, your body requires additional nutrition to help support your growing baby. °Eat a variety of foods, especially fruits and vegetables, to get a full range of vitamins and minerals. °Practice good food safety and cleanliness. Wash your hands before you eat and after you prepare raw meat. Wash all fruits and vegetables well before peeling or eating. Taking these actions can help   to prevent foodborne illnesses, such as listeriosis, that can be very dangerous to your baby. °Do not eat raw meat or fish. Do not eat fish that have high mercury content, such as tilefish, shark, swordfish, and king mackerel. Do not eat raw (unpasteurized) dairy. °Take a prenatal vitamin to help meet your additional vitamin and mineral needs during pregnancy, specifically for folic acid, iron, calcium, and vitamin D. °This information is not intended to replace advice given to you by your health care provider. Make sure you  discuss any questions you have with your health care provider. °Document Revised: 06/03/2020 Document Reviewed: 06/03/2020 °Elsevier Patient Education © 2022 [employer] ° °

## 2021-09-30 NOTE — Progress Notes (Signed)
ROB - no concerns. RM 4 °

## 2021-09-30 NOTE — Progress Notes (Signed)
Routine Prenatal Care Visit  Subjective  Joann Clark is a 35 y.o. [id number] at [redacted]w[redacted]d being seen today for ongoing prenatal care.  She is currently monitored for the following issues for this high-risk pregnancy and has Vitamin D deficiency; Supervision of high risk pregnancy, antepartum; Advanced maternal age in multigravida, unspecified trimester; and Obesity affecting pregnancy in third trimester on their problem list.  ----------------------------------------------------------------------------------- Patient reports no complaints.   Contractions: Not present. Vag. Bleeding: None.  Movement: Present. Leaking Fluid denies.  ----------------------------------------------------------------------------------- The following portions of the patient's history were reviewed and updated as appropriate: allergies, current medications, past family history, past medical history, past social history, past surgical history and problem list. Problem list updated.  Objective  Blood pressure 112/66, weight 235 lb (106.6 kg), last menstrual period 02/16/2021 Pregravid weight 205 lb (93 kg) Total Weight Gain 30 lb (13.6 kg) Urinalysis: Urine Protein    Urine Glucose    Fetal Status: Fetal Heart Rate (bpm): 135 Fundal Height: 33 cm Movement: Present     General:  Alert, oriented and cooperative. Patient is in no acute distress.  Skin: Skin is warm and dry. No rash noted.   Cardiovascular: Normal heart rate noted  Respiratory: Normal respiratory effort, no problems with respiration noted  Abdomen: Soft, gravid, appropriate for gestational age. Pain/Pressure: Absent     Pelvic:  Cervical exam deferred        Extremities: Normal range of motion.  Edema: None  Mental Status: Normal mood and affect. Normal behavior. Normal judgment and thought content.   Assessment   35 y.o. [id number] at [redacted]w[redacted]d by  11/23/2021, by Other Basis presenting for routine prenatal visit  Plan   Pregnancy Problems (from 03/24/21 to  present)    Problem Noted Resolved   Obesity affecting pregnancy in third trimester 09/02/2021 by Gae Dry, MD No   Overview Signed 09/02/2021 10:17 AM by Gae Dry, MD    BMI >=35.0-39.9 [x ] u/s for dating [x ]  [x ] nutritional goals [x ] folic acid 1mg  [x ] bASA (>12 weeks) [ ]  consider nutrition consult [ ]  consider maternal EKG 1st trimester [x ] Growth u/s 42 [x ], 32 [ ] , 36 weeks [ ]  [x ] NST/AFI weekly 37+ weeks (37[] , 38[] , 39[] , 40[] ) [x ] IOL by 41 weeks (scheduled, prn [] )       Advanced maternal age in multigravida, unspecified trimester 05/25/2021 by Imagene Riches, CNM No   Supervision of high risk pregnancy, antepartum 05/24/2021 by Imagene Riches, CNM No   Overview Addendum 09/16/2021  1:35 PM by Rod Can, CNM     Nursing Staff Provider  Office Location  [facility] Dating  Completed at MFM  Language   English Anatomy US  Scheduled with MFM  Flu Vaccine   Decline Genetic Screen  NIPS: MaternT- xx  TDaP vaccine   Decline Hgb A1C or  GTT Third trimester :   Covid    LAB RESULTS   Rhogam   NA Blood Type   B+  Feeding Plan  Breast Antibody  negative  Contraception Considering tubal- consent signed 07/22/21 Rubella  immune, Varicella non immune  Circumcision n/a RPR   NR  Pediatrician   HBsAg   negative  Support Person parents HIV  NR  Prenatal Classes  Varicella Non immune    GBS  (For PCN allergy, check sensitivities)   BTL Consent done    VBAC Consent n/a Pap  05/2021    MJ use-  UDS is positive for cannabinoids.          Preterm labor symptoms and general obstetric precautions including but not limited to vaginal bleeding, contractions, leaking of fluid and fetal movement were reviewed in detail with the patient. Please refer to After Visit Summary for other counseling recommendations.   Return in about 2 weeks (around 10/14/2021) for rob.  Tresea Mall, CNM 09/30/2021 9:39 AM

## 2021-10-17 ENCOUNTER — Ambulatory Visit (INDEPENDENT_AMBULATORY_CARE_PROVIDER_SITE_OTHER): Payer: Medicaid Other | Admitting: Advanced Practice Midwife

## 2021-10-17 ENCOUNTER — Other Ambulatory Visit: Payer: Self-pay

## 2021-10-17 ENCOUNTER — Encounter: Payer: Medicaid Other | Admitting: Obstetrics and Gynecology

## 2021-10-17 ENCOUNTER — Encounter: Payer: Self-pay | Admitting: Advanced Practice Midwife

## 2021-10-17 VITALS — BP 122/70 | Wt 240.0 lb

## 2021-10-17 DIAGNOSIS — Z3A34 34 weeks gestation of pregnancy: Secondary | ICD-10-CM

## 2021-10-17 DIAGNOSIS — O0993 Supervision of high risk pregnancy, unspecified, third trimester: Secondary | ICD-10-CM

## 2021-10-17 DIAGNOSIS — O09529 Supervision of elderly multigravida, unspecified trimester: Secondary | ICD-10-CM

## 2021-10-17 NOTE — Progress Notes (Signed)
No vb. No lof.  

## 2021-10-17 NOTE — Progress Notes (Signed)
Routine Prenatal Care Visit  Subjective  Joann Clark is a 35 y.o. [id number] at [redacted]w[redacted]d being seen today for ongoing prenatal care.  She is currently monitored for the following issues for this high-risk pregnancy and has Vitamin D deficiency; Supervision of high risk pregnancy, antepartum; Advanced maternal age in multigravida, unspecified trimester; and Obesity affecting pregnancy in third trimester on their problem list.  ----------------------------------------------------------------------------------- Patient reports no complaints.   Contractions: Not present. Vag. Bleeding: None.  Movement: Present. Leaking Fluid denies.  ----------------------------------------------------------------------------------- The following portions of the patient's history were reviewed and updated as appropriate: allergies, current medications, past family history, past medical history, past social history, past surgical history and problem list. Problem list updated.  Objective  Blood pressure 122/70, weight 240 lb (108.9 kg), last menstrual period 02/16/2021, unknown if currently breastfeeding. Pregravid weight 205 lb (93 kg) Total Weight Gain 35 lb (15.9 kg) Urinalysis: Urine Protein    Urine Glucose    Fetal Status: Fetal Heart Rate (bpm): 143 Fundal Height: 35 cm Movement: Present     General:  Alert, oriented and cooperative. Patient is in no acute distress.  Skin: Skin is warm and dry. No rash noted.   Cardiovascular: Normal heart rate noted  Respiratory: Normal respiratory effort, no problems with respiration noted  Abdomen: Soft, gravid, appropriate for gestational age. Pain/Pressure: Absent     Pelvic:  Cervical exam deferred        Extremities: Normal range of motion.  Edema: None  Mental Status: Normal mood and affect. Normal behavior. Normal judgment and thought content.   Assessment   35 y.o. [id number] at [redacted]w[redacted]d by  11/23/2021, by Other Basis presenting for routine prenatal visit  Plan    Pregnancy Problems (from 03/24/21 to present)    Problem Noted Resolved   Obesity affecting pregnancy in third trimester 09/02/2021 by Nadara Mustard, MD No   Overview Signed 09/02/2021 10:17 AM by Nadara Mustard, MD    BMI >=35.0-39.9 [x ] u/s for dating [x ]  [x ] nutritional goals [x ] folic acid 1mg  [x ] bASA (>12 weeks) [ ]  consider nutrition consult [ ]  consider maternal EKG 1st trimester [x ] Growth u/s 44 [x ], 32 [ ] , 36 weeks [ ]  [x ] NST/AFI weekly 37+ weeks (37[] , 38[] , 39[] , 40[] ) [x ] IOL by 41 weeks (scheduled, prn [] )       Advanced maternal age in multigravida, unspecified trimester 05/25/2021 by , CNM No   Supervision of high risk pregnancy, antepartum 05/24/2021 by , CNM No   Overview Addendum 09/16/2021  1:35 PM by , CNM     Nursing Staff Provider  Office Location  [facility] Dating  Completed at MFM  Language   English Anatomy 07/26/2021  Scheduled with MFM  Flu Vaccine   Decline Genetic Screen  NIPS: MaternT- xx  TDaP vaccine   Decline Hgb A1C or  GTT Third trimester :   Covid    LAB RESULTS   Rhogam   NA Blood Type   B+  Feeding Plan  Breast Antibody  negative  Contraception Considering tubal- consent signed 07/22/21 Rubella  immune, Varicella non immune  Circumcision n/a RPR   NR  Pediatrician   HBsAg   negative  Support Person parents HIV  NR  Prenatal Classes  Varicella Non immune    GBS  (For PCN allergy, check sensitivities)   BTL Consent done    VBAC Consent n/a Pap  05/2021  MJ use- UDS is positive for cannabinoids.          Preterm labor symptoms and general obstetric precautions including but not limited to vaginal bleeding, contractions, leaking of fluid and fetal movement were reviewed in detail with the patient.   Return in about 2 weeks (around 10/31/2021) for rob.  Rod Can, CNM 10/17/2021 11:30 AM

## 2021-10-20 DIAGNOSIS — Z419 Encounter for procedure for purposes other than remedying health state, unspecified: Secondary | ICD-10-CM | POA: Diagnosis not present

## 2021-10-28 ENCOUNTER — Other Ambulatory Visit (HOSPITAL_COMMUNITY)
Admission: RE | Admit: 2021-10-28 | Discharge: 2021-10-28 | Disposition: A | Discharge status: Home / Self Care | Payer: Medicaid Other | Source: Ambulatory Visit | Attending: Obstetrics | Admitting: Obstetrics

## 2021-10-28 ENCOUNTER — Other Ambulatory Visit: Payer: Self-pay

## 2021-10-28 ENCOUNTER — Ambulatory Visit (INDEPENDENT_AMBULATORY_CARE_PROVIDER_SITE_OTHER): Payer: Medicaid Other | Admitting: Obstetrics

## 2021-10-28 ENCOUNTER — Encounter: Payer: Medicaid Other | Admitting: Obstetrics

## 2021-10-28 VITALS — BP 118/82 | Wt 240.0 lb

## 2021-10-28 DIAGNOSIS — Z113 Encounter for screening for infections with a predominantly sexual mode of transmission: Secondary | ICD-10-CM | POA: Diagnosis not present

## 2021-10-28 DIAGNOSIS — F1290 Cannabis use, unspecified, uncomplicated: Secondary | ICD-10-CM

## 2021-10-28 DIAGNOSIS — O99320 Drug use complicating pregnancy, unspecified trimester: Secondary | ICD-10-CM | POA: Diagnosis not present

## 2021-10-28 DIAGNOSIS — Z3A35 35 weeks gestation of pregnancy: Secondary | ICD-10-CM

## 2021-10-28 DIAGNOSIS — O0993 Supervision of high risk pregnancy, unspecified, third trimester: Secondary | ICD-10-CM | POA: Diagnosis not present

## 2021-10-28 NOTE — Progress Notes (Signed)
Routine Prenatal Care Visit  Subjective  Joann Clark is a 35 y.o. [id number] at [redacted]w[redacted]d being seen today for ongoing prenatal care.  She is currently monitored for the following issues for this low-risk pregnancy and has Vitamin D deficiency; Supervision of high risk pregnancy, antepartum; Advanced maternal age in multigravida, unspecified trimester; and Obesity affecting pregnancy in third trimester on their problem list.  ----------------------------------------------------------------------------------- Patient reports no complaints.  Strong smell of MJ I exam room. Baby is moving well. She expresses some concern that the baby is measuring larger. S=D today  .  .   . Leaking Fluid denies.  ----------------------------------------------------------------------------------- The following portions of the patient's history were reviewed and updated as appropriate: allergies, current medications, past family history, past medical history, past social history, past surgical history and problem list. Problem list updated.  Objective  Blood pressure 118/82, weight 240 lb (108.9 kg), last menstrual period 02/16/2021, unknown if currently breastfeeding. Pregravid weight 205 lb (93 kg) Total Weight Gain 35 lb (15.9 kg) Urinalysis: Urine Protein    Urine Glucose    Fetal Status:           General:  Alert, oriented and cooperative. Patient is in no acute distress.  Skin: Skin is warm and dry. No rash noted.   Cardiovascular: Normal heart rate noted  Respiratory: Normal respiratory effort, no problems with respiration noted  Abdomen: Soft, gravid, appropriate for gestational age.       Pelvic:  Cervical exam deferred        Extremities: Normal range of motion.     Mental Status: Normal mood and affect. Normal behavior. Normal judgment and thought content.   Assessment   35 y.o. [id number] at [redacted]w[redacted]d by  11/23/2021, by Other Basis presenting for routine prenatal visit  Plan   Pregnancy Problems (from  03/24/21 to present)    Problem Noted Resolved   Obesity affecting pregnancy in third trimester 09/02/2021 by Nadara Mustard, MD No   Overview Signed 09/02/2021 10:17 AM by Nadara Mustard, MD    BMI >=35.0-39.9 [x ] u/s for dating [x ]  [x ] nutritional goals [x ] folic acid 1mg  [x ] bASA (>12 weeks) [ ]  consider nutrition consult [ ]  consider maternal EKG 1st trimester [x ] Growth u/s 37 [x ], 32 [ ] , 36 weeks [ ]  [x ] NST/AFI weekly 37+ weeks (37[] , 38[] , 39[] , 40[] ) [x ] IOL by 41 weeks (scheduled, prn [] )       Advanced maternal age in multigravida, unspecified trimester 05/25/2021 by , CNM No   Supervision of high risk pregnancy, antepartum 05/24/2021 by , CNM No   Overview Addendum 09/16/2021  1:35 PM by , CNM     Nursing Staff Provider  Office Location  [facility] Dating  Completed at MFM  Language   English Anatomy 07/26/2021  Scheduled with MFM  Flu Vaccine   Decline Genetic Screen  NIPS: MaternT- xx  TDaP vaccine   Decline Hgb A1C or  GTT Third trimester :   Covid    LAB RESULTS   Rhogam   NA Blood Type   B+  Feeding Plan  Breast Antibody  negative  Contraception Considering tubal- consent signed 07/22/21 Rubella  immune, Varicella non immune  Circumcision n/a RPR   NR  Pediatrician   HBsAg   negative  Support Person parents HIV  NR  Prenatal Classes  Varicella Non immune    GBS  (For PCN allergy, check sensitivities)  BTL Consent done    VBAC Consent n/a Pap  05/2021    MJ use- UDS is positive for cannabinoids.          Term labor symptoms and general obstetric precautions including but not limited to vaginal bleeding, contractions, leaking of fluid and fetal movement were reviewed in detail with the patient. Please refer to After Visit Summary for other counseling recommendations.  UDS, cultures for GC, CT, and GBS        Retrieved today.  Return in about 1 week (around 11/04/2021) for return OB.  Mirna Mires, CNM   10/28/2021 2:20 PM

## 2021-10-28 NOTE — Progress Notes (Signed)
ROB - GBS, no concerns. RM 7

## 2021-11-01 LAB — CERVICOVAGINAL ANCILLARY ONLY
Bacterial Vaginitis (gardnerella): NEGATIVE
Chlamydia: NEGATIVE
Comment: NEGATIVE
Comment: NEGATIVE
Comment: NEGATIVE
Comment: NORMAL
Neisseria Gonorrhea: NEGATIVE
Trichomonas: NEGATIVE

## 2021-11-01 LAB — STREP GP B CULTURE+RFLX: Strep Gp B Culture+Rflx: NEGATIVE

## 2021-11-02 ENCOUNTER — Other Ambulatory Visit: Payer: Self-pay

## 2021-11-02 ENCOUNTER — Ambulatory Visit (INDEPENDENT_AMBULATORY_CARE_PROVIDER_SITE_OTHER): Payer: Medicaid Other | Admitting: Licensed Practical Nurse

## 2021-11-02 ENCOUNTER — Encounter: Payer: Self-pay | Admitting: Licensed Practical Nurse

## 2021-11-02 VITALS — BP 126/84 | Wt 243.0 lb

## 2021-11-02 DIAGNOSIS — Z3A37 37 weeks gestation of pregnancy: Secondary | ICD-10-CM

## 2021-11-02 DIAGNOSIS — O0990 Supervision of high risk pregnancy, unspecified, unspecified trimester: Secondary | ICD-10-CM

## 2021-11-02 LAB — URINE DRUG PANEL 7
Amphetamines, Urine: NEGATIVE ng/mL
Barbiturate Quant, Ur: NEGATIVE ng/mL
Benzodiazepine Quant, Ur: NEGATIVE ng/mL
Cannabinoid Quant, Ur: NEGATIVE
Cocaine (Metab.): NEGATIVE ng/mL
Opiate Quant, Ur: NEGATIVE ng/mL
PCP Quant, Ur: NEGATIVE ng/mL

## 2021-11-02 NOTE — Progress Notes (Signed)
No vb. No lof.  

## 2021-11-02 NOTE — Progress Notes (Signed)
Routine Prenatal Care Visit  Subjective  Joann Clark is a 35 y.o. [id number] at [redacted]w[redacted]d being seen today for ongoing prenatal care.  She is currently monitored for the following issues for this high-risk pregnancy and has Vitamin D deficiency; Supervision of high risk pregnancy, antepartum; Advanced maternal age in multigravida, unspecified trimester; and Obesity affecting pregnancy in third trimester on their problem list.  ----------------------------------------------------------------------------------- Patient reports no complaints.  Now undecided about Tubal, may consider IUD instead.  Contractions: Not present. Vag. Bleeding: None.  Movement: Present. Leaking Fluid denies.  ----------------------------------------------------------------------------------- The following portions of the patient's history were reviewed and updated as appropriate: allergies, current medications, past family history, past medical history, past social history, past surgical history and problem list. Problem list updated.  Objective  Blood pressure 126/84, weight 243 lb (110.2 kg), last menstrual period 02/16/2021, unknown if currently breastfeeding. Pregravid weight 205 lb (93 kg) Total Weight Gain 38 lb (17.2 kg) Urinalysis: Urine Protein    Urine Glucose    Fetal Status:     Movement: Present     General:  Alert, oriented and cooperative. Patient is in no acute distress.  Skin: Skin is warm and dry. No rash noted.   Cardiovascular: Normal heart rate noted  Respiratory: Normal respiratory effort, no problems with respiration noted  Abdomen: Soft, gravid, appropriate for gestational age. Pain/Pressure: Absent     Pelvic:  Cervical exam deferred        Extremities: Normal range of motion.     Mental Status: Normal mood and affect. Normal behavior. Normal judgment and thought content.   Assessment   35 y.o. [id number] at [redacted]w[redacted]d by  11/23/2021, by Other Basis presenting for routine prenatal visit  Plan    Pregnancy Problems (from 03/24/21 to present)     Problem Noted Resolved   Obesity affecting pregnancy in third trimester 09/02/2021 by Nadara Mustard, MD No   Overview Signed 09/02/2021 10:17 AM by Nadara Mustard, MD    BMI >=35.0-39.9 [x ] u/s for dating [x ]  [x ] nutritional goals [x ] folic acid 1mg  [x ] bASA (>12 weeks) [ ]  consider nutrition consult [ ]  consider maternal EKG 1st trimester [x ] Growth u/s 42 [x ], 32 [ ] , 36 weeks [ ]  [x ] NST/AFI weekly 37+ weeks (37[] , 38[] , 39[] , 40[] ) [x ] IOL by 41 weeks (scheduled, prn [] )       Advanced maternal age in multigravida, unspecified trimester 05/25/2021 by , CNM No   Supervision of high risk pregnancy, antepartum 05/24/2021 by , CNM No   Overview Addendum 11/01/2021  6:18 PM by , CNM     Nursing Staff Provider  Office Location  [facility] Dating  Completed at MFM  Language   English Anatomy 07/26/2021  Scheduled with MFM  Flu Vaccine   Decline Genetic Screen  NIPS: MaternT- xx  TDaP vaccine   Decline Hgb A1C or  GTT Third trimester :   Covid    LAB RESULTS   Rhogam   NA Blood Type   B+  Feeding Plan  Breast Antibody  negative  Contraception Considering tubal- consent signed 07/22/21 Rubella  immune, Varicella non immune  Circumcision n/a RPR   NR  Pediatrician   HBsAg   negative  Support Person parents HIV  NR  Prenatal Classes  Varicella Non immune    GBS  (For PCN allergy, check sensitivities) negative  BTL Consent done    VBAC Consent  n/a Pap  05/2021    MJ use- UDS is positive for cannabinoids.           Term labor symptoms and general obstetric precautions including but not limited to vaginal bleeding, contractions, leaking of fluid and fetal movement were reviewed in detail with the patient. Please refer to After Visit Summary for other counseling recommendations.   NST not done today, will do weekly nst's until birth  Order placed for AFI/growth ASAP   Return  in 1 week  Roberto Scales, [place], [facility]  11/02/21  3:38 PM

## 2021-11-02 NOTE — Progress Notes (Signed)
Telephone call to pt Reviewed plan for weekly NST'S, in meantime you should do nightly kick counts-reviewed how and when to do kick counts and when to be concerned.  Also reviewed plan to have growth scan Pt verbalized understanding

## 2021-11-04 NOTE — Addendum Note (Signed)
Addended by: Kathlene Cote on: 11/04/2021 03:34 PM   Modules accepted: Orders

## 2021-11-09 ENCOUNTER — Ambulatory Visit: Payer: Medicaid Other

## 2021-11-11 ENCOUNTER — Ambulatory Visit (INDEPENDENT_AMBULATORY_CARE_PROVIDER_SITE_OTHER): Payer: Medicaid Other | Admitting: Obstetrics

## 2021-11-11 ENCOUNTER — Observation Stay
Admission: EM | Admit: 2021-11-11 | Discharge: 2021-11-11 | Disposition: A | Discharge status: Home / Self Care | Payer: Medicaid Other | Attending: Obstetrics & Gynecology | Admitting: Obstetrics & Gynecology

## 2021-11-11 ENCOUNTER — Encounter: Payer: Self-pay | Admitting: Obstetrics & Gynecology

## 2021-11-11 ENCOUNTER — Other Ambulatory Visit: Payer: Self-pay

## 2021-11-11 VITALS — BP 100/66 | Wt 247.0 lb

## 2021-11-11 DIAGNOSIS — O288 Other abnormal findings on antenatal screening of mother: Secondary | ICD-10-CM

## 2021-11-11 DIAGNOSIS — Z87891 Personal history of nicotine dependence: Secondary | ICD-10-CM | POA: Insufficient documentation

## 2021-11-11 DIAGNOSIS — O0993 Supervision of high risk pregnancy, unspecified, third trimester: Secondary | ICD-10-CM | POA: Diagnosis not present

## 2021-11-11 DIAGNOSIS — O0990 Supervision of high risk pregnancy, unspecified, unspecified trimester: Secondary | ICD-10-CM

## 2021-11-11 DIAGNOSIS — Z3A38 38 weeks gestation of pregnancy: Secondary | ICD-10-CM | POA: Insufficient documentation

## 2021-11-11 DIAGNOSIS — O368330 Maternal care for abnormalities of the fetal heart rate or rhythm, third trimester, not applicable or unspecified: Secondary | ICD-10-CM | POA: Diagnosis not present

## 2021-11-11 DIAGNOSIS — O99213 Obesity complicating pregnancy, third trimester: Secondary | ICD-10-CM

## 2021-11-11 DIAGNOSIS — O09523 Supervision of elderly multigravida, third trimester: Secondary | ICD-10-CM | POA: Insufficient documentation

## 2021-11-11 DIAGNOSIS — E669 Obesity, unspecified: Secondary | ICD-10-CM | POA: Diagnosis not present

## 2021-11-11 DIAGNOSIS — O368390 Maternal care for abnormalities of the fetal heart rate or rhythm, unspecified trimester, not applicable or unspecified: Secondary | ICD-10-CM | POA: Diagnosis present

## 2021-11-11 DIAGNOSIS — O09529 Supervision of elderly multigravida, unspecified trimester: Secondary | ICD-10-CM

## 2021-11-11 NOTE — Discharge Summary (Signed)
  See FPN 

## 2021-11-11 NOTE — Progress Notes (Signed)
ROB/NST- no concerns 

## 2021-11-11 NOTE — OB Triage Note (Signed)
Pt sent from office for non-reactive NST and extended monitoring. Pt denies pain, bleeding or LOF. Reports positive fetal movement. VSS. Will continue to monitor.

## 2021-11-11 NOTE — Progress Notes (Signed)
Pt removed from monitor for d/c. D/c instructions, appointments, and education given and explained. Pt verbalized understanding with no further questions. Pt wheeled to personal vehicle for d/c via staff.

## 2021-11-11 NOTE — Progress Notes (Signed)
°   11/11/21 1730  Fetal Heart Rate A  Mode External  Baseline Rate (A) 140 bpm  Variability 6-25 BPM  Accelerations 15 x 15  Decelerations None  Uterine Activity  Mode Toco  Contraction Frequency (min) none (maternal movement noted)   Reactive NST verified with Roxine Caddy, RN and Tresea Mall CNM notified.

## 2021-11-11 NOTE — Discharge Summary (Signed)
Physician Final Progress Note  Patient ID: Joann Clark MRN: [id number] DOB/AGE: 08-10-1986 35 y.o.  Admit date: 11/11/2021 Admitting provider: Rod Can, CNM Discharge date: 11/11/2021   Admission Diagnoses:  1) intrauterine pregnancy at [redacted]w[redacted]d  2) Non-reactive NST in office  Discharge Diagnoses:  Active Problems:   Supervision of high risk pregnancy, antepartum   Advanced maternal age in multigravida, unspecified trimester   Obesity affecting pregnancy in third trimester   Labor and delivery, indication for care   [redacted] weeks gestation of pregnancy   Reactive NST  History of Present Illness: The patient is a 35 y.o. female [id number] at [redacted]w[redacted]d who presents for non-reactive NST in clinic today. The patient was admitted for observation and placed on monitors. Eventually reactive NST was obtained. Variability was moderate throughout. Toco negative for contractions. Patient was discharged to home with instructions and precautions.    Past Medical History:  Diagnosis Date   Encounter for surveillance of injectable contraceptive 03/27/2018   Medical history non-contributory    Vaginal Pap smear with ASC-US     History reviewed. No pertinent surgical history.  No current facility-administered medications on file prior to encounter.   Current Outpatient Medications on File Prior to Encounter  Medication Sig Dispense Refill   Prenatal Vit-Fe Fumarate-FA (PRENATAL MULTIVITAMIN) TABS tablet Take 1 tablet by mouth daily at 12 noon. 30 tablet 10    No Known Allergies  Social History   Socioeconomic History   Marital status: Single    Spouse name: Not on file   Number of children: Not on file   Years of education: Not on file   Highest education level: Not on file  Occupational History   Not on file  Tobacco Use   Smoking status: Former   Smokeless tobacco: Never  Vaping Use   Vaping Use: Never used  Substance and Sexual Activity   Alcohol use: Yes   Drug use: No   Sexual  activity: Yes    Birth control/protection: I.U.D., Surgical    Comment: undecided  Other Topics Concern   Not on file  Social History Narrative   Not on file   Social Determinants of Health   Financial Resource Strain: Not on file  Food Insecurity: Not on file  Transportation Needs: Not on file  Physical Activity: Not on file  Stress: Not on file  Social Connections: Not on file  Intimate Partner Violence: Not on file    Family History  Problem Relation Age of Onset   Hypertension Mother      Review of Systems  Constitutional:  Negative for chills and fever.  HENT:  Negative for congestion, ear discharge, ear pain, hearing loss, sinus pain and sore throat.   Eyes:  Negative for blurred vision and double vision.  Respiratory:  Negative for cough, shortness of breath and wheezing.   Cardiovascular:  Negative for chest pain, palpitations and leg swelling.  Gastrointestinal:  Negative for abdominal pain, blood in stool, constipation, diarrhea, heartburn, melena, nausea and vomiting.  Genitourinary:  Negative for dysuria, flank pain, frequency, hematuria and urgency.  Musculoskeletal:  Negative for back pain, joint pain and myalgias.  Skin:  Negative for itching and rash.  Neurological:  Negative for dizziness, tingling, tremors, sensory change, speech change, focal weakness, seizures, loss of consciousness, weakness and headaches.  Endo/Heme/Allergies:  Negative for environmental allergies. Does not bruise/bleed easily.  Psychiatric/Behavioral:  Negative for depression, hallucinations, memory loss, substance abuse and suicidal ideas. The patient is not nervous/anxious and does not  have insomnia.     Physical Exam: BP 123/67 (BP Location: Left Arm)    Pulse 85    Temp 98.5 F (36.9 C) (Oral)    Resp 16    Ht 5\' 5"  (1.651 m)    Wt 112 kg    LMP 02/16/2021    SpO2 100%    BMI 41.10 kg/m   Constitutional: Well nourished, well developed female in no acute distress.  HEENT:  normal Skin: Warm and dry.  Cardiovascular: Regular rate and rhythm.   Extremity:  no edema   Respiratory: Clear to auscultation bilateral. Normal respiratory effort Abdomen: FHT present Neuro: DTRs 2+, Cranial nerves grossly intact Psych: Alert and Oriented x3. No memory deficits. Normal mood and affect.   Toco: negative for contractions Fetal well being: 140 bpm, moderate variability, + 15x15 accelerations, -decelerations  Consults: None  Significant Findings/ Diagnostic Studies: none  Procedures: NST  Hospital Course: The patient was admitted to Labor and Delivery Triage for observation.   Discharge Condition: good  Disposition: Discharge disposition: 01-Home or Self Care  Diet: Regular diet  Discharge Activity: Activity as tolerated  Discharge Instructions     Call MD for:   Complete by: As directed    Worsening contractions or pain; leakage of fluid; bleeding.   Diet - low sodium heart healthy   Complete by: As directed    Discharge activity:  No Restrictions   Complete by: As directed    Increase activity slowly   Complete by: As directed    LABOR:  When conractions begin, you should start to time them from the beginning of one contraction to the beginning  of the next.  When contractions are 5 - 10 minutes apart or less and have been regular for at least an hour, you should call your health care provider.   Complete by: As directed    Notify physician for bleeding from the vagina   Complete by: As directed    Notify physician for blurring of vision or spots before the eyes   Complete by: As directed    Notify physician for chills or fever   Complete by: As directed    Notify physician for fainting spells, "black outs" or loss of consciousness   Complete by: As directed    Notify physician for increase in vaginal discharge   Complete by: As directed    Notify physician for leaking of fluid   Complete by: As directed    Notify physician for pain or burning  when urinating   Complete by: As directed    Notify physician for pelvic pressure (sudden increase)   Complete by: As directed    Notify physician for severe or continued nausea or vomiting   Complete by: As directed    Notify physician for sudden gushing of fluid from the vagina (with or without continued leaking)   Complete by: As directed    Notify physician for sudden, constant, or occasional abdominal pain   Complete by: As directed    Notify physician if baby moving less than usual   Complete by: As directed       Allergies as of 11/11/2021   No Known Allergies      Medication List     TAKE these medications    prenatal multivitamin Tabs tablet Take 1 tablet by mouth daily at 12 noon.        [place]. Go to.   Specialty: Obstetrics  and Gynecology Why: scheduled appointment Contact information: [address] [place] [address] [phone]                Total time spent taking care of this patient: 21 minutes  Signed: Tresea Mall, CNM  11/11/2021, 6:20 PM

## 2021-11-11 NOTE — Progress Notes (Signed)
Routine Prenatal Care Visit  Subjective  Joann Clark is a 35 y.o. [id number] at [redacted]w[redacted]d being seen today for ongoing prenatal care.  She is currently monitored for the following issues for this high-risk pregnancy and has Vitamin D deficiency; Supervision of high risk pregnancy, antepartum; Advanced maternal age in multigravida, unspecified trimester; and Obesity affecting pregnancy in third trimester on their problem list.  ----------------------------------------------------------------------------------- Patient reports no complaints.    .  .   Pincus Large Fluid denies.  ----------------------------------------------------------------------------------- The following portions of the patient's history were reviewed and updated as appropriate: allergies, current medications, past family history, past medical history, past social history, past surgical history and problem list. Problem list updated.  Objective  Blood pressure 100/66, weight 247 lb (112 kg), last menstrual period 02/16/2021, unknown if currently breastfeeding. Pregravid weight 205 lb (93 kg) Total Weight Gain 42 lb (19.1 kg) Urinalysis: Urine Protein    Urine Glucose    Fetal Status:           General:  Alert, oriented and cooperative. Patient is in no acute distress.  Skin: Skin is warm and dry. No rash noted.   Cardiovascular: Normal heart rate noted  Respiratory: Normal respiratory effort, no problems with respiration noted  Abdomen: Soft, gravid, appropriate for gestational age.       Pelvic:  Cervical exam performed        Extremities: Normal range of motion.     Mental Status: Normal mood and affect. Normal behavior. Normal judgment and thought content.   Assessment   35 y.o. [id number] at [redacted]w[redacted]d by  11/23/2021, by Other Basis presenting for routine prenatal visit  Plan   Pregnancy Problems (from 03/24/21 to present)    Problem Noted Resolved   Obesity affecting pregnancy in third trimester 09/02/2021 by Nadara Mustard, MD No   Overview Signed 09/02/2021 10:17 AM by Nadara Mustard, MD    BMI >=35.0-39.9 [x ] u/s for dating [x ]  [x ] nutritional goals [x ] folic acid 1mg  [x ] bASA (>12 weeks) [ ]  consider nutrition consult [ ]  consider maternal EKG 1st trimester [x ] Growth u/s 54 [x ], 32 [ ] , 36 weeks [ ]  [x ] NST/AFI weekly 37+ weeks (37[] , 38[] , 39[] , 40[] ) [x ] IOL by 41 weeks (scheduled, prn [] )       Advanced maternal age in multigravida, unspecified trimester 05/25/2021 by , CNM No   Supervision of high risk pregnancy, antepartum 05/24/2021 by , CNM No   Overview Addendum 11/01/2021  6:18 PM by , CNM     Nursing Staff Provider  Office Location  [facility] Dating  Completed at MFM  Language   English Anatomy 07/26/2021  Scheduled with MFM  Flu Vaccine   Decline Genetic Screen  NIPS: MaternT- xx  TDaP vaccine   Decline Hgb A1C or  GTT Third trimester :   Covid    LAB RESULTS   Rhogam   NA Blood Type   B+  Feeding Plan  Breast Antibody  negative  Contraception Considering tubal- consent signed 07/22/21 Rubella  immune, Varicella non immune  Circumcision n/a RPR   NR  Pediatrician   HBsAg   negative  Support Person parents HIV  NR  Prenatal Classes  Varicella Non immune    GBS  (For PCN allergy, check sensitivities) negative  BTL Consent done    VBAC Consent n/a Pap  05/2021    MJ use- UDS is  positive for cannabinoids.          Term labor symptoms and general obstetric precautions including but not limited to vaginal bleeding, contractions, leaking of fluid and fetal movement were reviewed in detail with the patient. Please refer to After Visit Summary for other counseling recommendations.  Discussed weekly NSTs till delivery. She has a growth scan set up for 12/27. She declines a vaginal exam today. Her NST is not reactive, despite some acoustic stimulation, position changes. Sent to labor and Delivery for some extended monitoring.  Return  in about 1 week (around 11/18/2021) for return OB, NST.  Imagene Riches, CNM  11/11/2021 1:56 PM

## 2021-11-12 ENCOUNTER — Telehealth: Payer: Self-pay

## 2021-11-12 NOTE — Telephone Encounter (Signed)
Transition Care Management Unsuccessful Follow-up Telephone Call ° °Date of discharge and from where:  11/11/2021-[facility] ° °Attempts:  1st Attempt ° °Reason for unsuccessful TCM follow-up call:  Left voice message ° °  °

## 2021-11-15 ENCOUNTER — Ambulatory Visit: Payer: Medicaid Other

## 2021-11-15 ENCOUNTER — Encounter: Payer: Self-pay | Admitting: Obstetrics and Gynecology

## 2021-11-15 ENCOUNTER — Other Ambulatory Visit: Payer: Self-pay

## 2021-11-15 ENCOUNTER — Inpatient Hospital Stay
Admission: EM | Admit: 2021-11-15 | Discharge: 2021-11-16 | DRG: 807 | Disposition: A | Discharge status: Home / Self Care | Payer: Medicaid Other | Attending: Obstetrics & Gynecology | Admitting: Obstetrics & Gynecology

## 2021-11-15 DIAGNOSIS — Z20822 Contact with and (suspected) exposure to covid-19: Secondary | ICD-10-CM | POA: Diagnosis not present

## 2021-11-15 DIAGNOSIS — O99213 Obesity complicating pregnancy, third trimester: Secondary | ICD-10-CM

## 2021-11-15 DIAGNOSIS — O09523 Supervision of elderly multigravida, third trimester: Secondary | ICD-10-CM | POA: Diagnosis not present

## 2021-11-15 DIAGNOSIS — Z87891 Personal history of nicotine dependence: Secondary | ICD-10-CM

## 2021-11-15 DIAGNOSIS — O99214 Obesity complicating childbirth: Secondary | ICD-10-CM | POA: Diagnosis not present

## 2021-11-15 DIAGNOSIS — O479 False labor, unspecified: Secondary | ICD-10-CM | POA: Insufficient documentation

## 2021-11-15 DIAGNOSIS — Z3A38 38 weeks gestation of pregnancy: Secondary | ICD-10-CM | POA: Diagnosis not present

## 2021-11-15 DIAGNOSIS — Z641 Problems related to multiparity: Secondary | ICD-10-CM

## 2021-11-15 DIAGNOSIS — O288 Other abnormal findings on antenatal screening of mother: Secondary | ICD-10-CM | POA: Insufficient documentation

## 2021-11-15 DIAGNOSIS — O0990 Supervision of high risk pregnancy, unspecified, unspecified trimester: Secondary | ICD-10-CM

## 2021-11-15 DIAGNOSIS — O09529 Supervision of elderly multigravida, unspecified trimester: Secondary | ICD-10-CM

## 2021-11-15 DIAGNOSIS — O26893 Other specified pregnancy related conditions, third trimester: Secondary | ICD-10-CM | POA: Diagnosis not present

## 2021-11-15 LAB — CBC
HCT: 39.3 % (ref 36.0–46.0)
Hemoglobin: 13.3 g/dL (ref 12.0–15.0)
MCH: 30.5 pg (ref 26.0–34.0)
MCHC: 33.8 g/dL (ref 30.0–36.0)
MCV: 90.1 fL (ref 80.0–100.0)
Platelets: 298 10*3/uL (ref 150–400)
RBC: 4.36 MIL/uL (ref 3.87–5.11)
RDW: 14.2 % (ref 11.5–15.5)
WBC: 7.6 10*3/uL (ref 4.0–10.5)
nRBC: 0 % (ref 0.0–0.2)

## 2021-11-15 LAB — RAPID HIV SCREEN (HIV 1/2 AB+AG)
HIV 1/2 Antibodies: NONREACTIVE
HIV-1 P24 Antigen - HIV24: NONREACTIVE

## 2021-11-15 LAB — TYPE AND SCREEN
ABO/RH(D): B POS
Antibody Screen: NEGATIVE

## 2021-11-15 LAB — RESP PANEL BY RT-PCR (FLU A&B, COVID) ARPGX2
Influenza A by PCR: NEGATIVE
Influenza B by PCR: NEGATIVE
SARS Coronavirus 2 by RT PCR: NEGATIVE

## 2021-11-15 MED ORDER — MISOPROSTOL 200 MCG PO TABS
ORAL_TABLET | ORAL | Status: AC
  Filled 2021-11-15: qty 4

## 2021-11-15 MED ORDER — WITCH HAZEL-GLYCERIN EX PADS
1.0000 "application " | MEDICATED_PAD | CUTANEOUS | Status: DC | PRN
  Administered 2021-11-15: 1 via TOPICAL
  Filled 2021-11-15: qty 100

## 2021-11-15 MED ORDER — OXYCODONE-ACETAMINOPHEN 5-325 MG PO TABS: 2.0000 | ORAL_TABLET | ORAL | Status: DC | PRN

## 2021-11-15 MED ORDER — LACTATED RINGERS IV SOLN: 500.0000 mL | INTRAVENOUS | Status: DC | PRN

## 2021-11-15 MED ORDER — TERBUTALINE SULFATE 1 MG/ML IJ SOLN: 0.2500 mg | Freq: Once | INTRAMUSCULAR | Status: DC | PRN

## 2021-11-15 MED ORDER — LACTATED RINGERS IV BOLUS
1000.0000 mL | Freq: Once | INTRAVENOUS | Status: AC
  Administered 2021-11-15: 06:00:00 1000 mL via INTRAVENOUS

## 2021-11-15 MED ORDER — SODIUM CHLORIDE 0.9% FLUSH: 3.0000 mL | Freq: Two times a day (BID) | INTRAVENOUS | Status: DC

## 2021-11-15 MED ORDER — SODIUM CHLORIDE 0.9 % IV SOLN: 250.0000 mL | INTRAVENOUS | Status: DC | PRN

## 2021-11-15 MED ORDER — SOD CITRATE-CITRIC ACID 500-334 MG/5ML PO SOLN: 30.0000 mL | ORAL | Status: DC | PRN

## 2021-11-15 MED ORDER — LIDOCAINE HCL (PF) 1 % IJ SOLN
30.0000 mL | INTRAMUSCULAR | Status: DC | PRN
  Filled 2021-11-15: qty 30

## 2021-11-15 MED ORDER — ZOLPIDEM TARTRATE 5 MG PO TABS: 5.0000 mg | ORAL_TABLET | Freq: Every evening | ORAL | Status: DC | PRN

## 2021-11-15 MED ORDER — SODIUM CHLORIDE 0.9% FLUSH: 3.0000 mL | INTRAVENOUS | Status: DC | PRN

## 2021-11-15 MED ORDER — OXYTOCIN-SODIUM CHLORIDE 30-0.9 UT/500ML-% IV SOLN
2.5000 [IU]/h | INTRAVENOUS | Status: DC
  Filled 2021-11-15: qty 500

## 2021-11-15 MED ORDER — SENNOSIDES-DOCUSATE SODIUM 8.6-50 MG PO TABS
2.0000 | ORAL_TABLET | ORAL | Status: DC
  Administered 2021-11-15 – 2021-11-16 (×2): 2 via ORAL
  Filled 2021-11-15 (×2): qty 2

## 2021-11-15 MED ORDER — OXYTOCIN BOLUS FROM INFUSION
333.0000 mL | Freq: Once | INTRAVENOUS | Status: AC
  Administered 2021-11-15: 14:00:00 333 mL via INTRAVENOUS

## 2021-11-15 MED ORDER — LACTATED RINGERS IV SOLN: INTRAVENOUS | Status: DC

## 2021-11-15 MED ORDER — ONDANSETRON HCL 4 MG/2ML IJ SOLN: 4.0000 mg | Freq: Four times a day (QID) | INTRAMUSCULAR | Status: DC | PRN

## 2021-11-15 MED ORDER — IBUPROFEN 600 MG PO TABS
600.0000 mg | ORAL_TABLET | Freq: Four times a day (QID) | ORAL | Status: DC
  Administered 2021-11-15 – 2021-11-16 (×4): 600 mg via ORAL
  Filled 2021-11-15 (×4): qty 1

## 2021-11-15 MED ORDER — AMMONIA AROMATIC IN INHA
RESPIRATORY_TRACT | Status: AC
  Filled 2021-11-15: qty 10

## 2021-11-15 MED ORDER — DIBUCAINE (PERIANAL) 1 % EX OINT: 1.0000 "application " | TOPICAL_OINTMENT | CUTANEOUS | Status: DC | PRN

## 2021-11-15 MED ORDER — IBUPROFEN 600 MG PO TABS
ORAL_TABLET | ORAL | Status: AC
  Administered 2021-11-15: 15:00:00 600 mg via ORAL
  Filled 2021-11-15: qty 1

## 2021-11-15 MED ORDER — OXYTOCIN-SODIUM CHLORIDE 30-0.9 UT/500ML-% IV SOLN: 1.0000 m[IU]/min | INTRAVENOUS | Status: DC

## 2021-11-15 MED ORDER — ONDANSETRON HCL 4 MG/2ML IJ SOLN: 4.0000 mg | INTRAMUSCULAR | Status: DC | PRN

## 2021-11-15 MED ORDER — OXYCODONE-ACETAMINOPHEN 5-325 MG PO TABS: 1.0000 | ORAL_TABLET | ORAL | Status: DC | PRN

## 2021-11-15 MED ORDER — ONDANSETRON HCL 4 MG PO TABS: 4.0000 mg | ORAL_TABLET | ORAL | Status: DC | PRN

## 2021-11-15 MED ORDER — BUTORPHANOL TARTRATE 1 MG/ML IJ SOLN
2.0000 mg | INTRAMUSCULAR | Status: DC | PRN
  Administered 2021-11-15 (×2): 2 mg via INTRAVENOUS
  Filled 2021-11-15 (×2): qty 2

## 2021-11-15 MED ORDER — DIPHENHYDRAMINE HCL 25 MG PO CAPS: 25.0000 mg | ORAL_CAPSULE | Freq: Four times a day (QID) | ORAL | Status: DC | PRN

## 2021-11-15 MED ORDER — VARICELLA VIRUS VACCINE LIVE 1350 PFU/0.5ML IJ SUSR
0.5000 mL | Freq: Once | INTRAMUSCULAR | Status: DC
  Filled 2021-11-15 (×2): qty 0.5

## 2021-11-15 MED ORDER — OXYTOCIN 10 UNIT/ML IJ SOLN
INTRAMUSCULAR | Status: AC
  Filled 2021-11-15: qty 2

## 2021-11-15 MED ORDER — ACETAMINOPHEN 325 MG PO TABS
650.0000 mg | ORAL_TABLET | ORAL | Status: DC | PRN
  Administered 2021-11-15 – 2021-11-16 (×3): 650 mg via ORAL
  Filled 2021-11-15 (×3): qty 2

## 2021-11-15 MED ORDER — MISOPROSTOL 25 MCG QUARTER TABLET: 25.0000 ug | ORAL_TABLET | ORAL | Status: DC | PRN

## 2021-11-15 MED ORDER — COCONUT OIL OIL: 1.0000 "application " | TOPICAL_OIL | Status: DC | PRN

## 2021-11-15 MED ORDER — BENZOCAINE-MENTHOL 20-0.5 % EX AERO
1.0000 "application " | INHALATION_SPRAY | CUTANEOUS | Status: DC | PRN
  Administered 2021-11-15: 1 via TOPICAL
  Filled 2021-11-15: qty 56

## 2021-11-15 MED ORDER — SIMETHICONE 80 MG PO CHEW: 80.0000 mg | CHEWABLE_TABLET | ORAL | Status: DC | PRN

## 2021-11-15 MED ORDER — ACETAMINOPHEN 325 MG PO TABS: 650.0000 mg | ORAL_TABLET | ORAL | Status: DC | PRN

## 2021-11-15 NOTE — Discharge Summary (Signed)
OB Discharge Summary     Patient Name: Joann Clark DOB: Dec 30, 1985 MRN: [id number]  Date of admission: 11/15/2021 Delivering MD: Mirna Mires, CNM  Date of Delivery: 11/15/2021  Date of discharge: 11/16/2021  Admitting diagnosis: Uterine contractions [O47.9] Non-stress test nonreactive [O28.8] Intrauterine pregnancy: [redacted]w[redacted]d     Secondary diagnosis: None     Discharge diagnosis: Term Pregnancy Delivered, No other diagnosis                         Hospital course:  Onset of Labor With Vaginal Delivery      35 y.o. yo [id number] at [redacted]w[redacted]d was admitted in Active Labor on 11/15/2021. Patient had an uncomplicated labor course as follows:  Membrane Rupture Time/Date: 9:44 AM ,11/15/2021   Delivery Method:Vaginal, Spontaneous  Episiotomy: None  Lacerations:  None  Patient had an uncomplicated postpartum course.  She is ambulating, tolerating a regular diet, passing flatus, and urinating well. Patient is discharged home in stable condition on 11/16/21.  Newborn Data: Birth date:11/15/2021  Birth time:1:53 PM  Gender:Female  Living status:Living  Apgars:8 ,8  Weight:3540 g                                                                  Post partum procedures:none  Complications: None  Physical exam on 11/16/2021: Vitals:   11/15/21 1921 11/15/21 2300 11/16/21 0311 11/16/21 0737  BP: 111/70 109/69 122/79 104/62  Pulse: 89 83 93 87  Resp: 18 18 18 20   Temp: 99.1 F (37.3 C) 98.4 F (36.9 C) 97.8 F (36.6 C) 98.3 F (36.8 C)  TempSrc: Oral Oral Oral Oral  SpO2: 98% 97% 96% 98%  Weight:      Height:       General: alert, cooperative, and no distress Lochia: appropriate Uterine Fundus: firm Incision: N/A DVT Evaluation: No evidence of DVT seen on physical exam. Negative Homan's sign.  Labs: Lab Results  Component Value Date   WBC 10.8 (H) 11/16/2021   HGB 11.2 (L) 11/16/2021   HCT 33.6 (L) 11/16/2021   MCV 90.6 11/16/2021   PLT 287 11/16/2021   CMP Latest Ref  Rng & Units 05/14/2018  Glucose 70 - 99 mg/dL 05/16/2018)  BUN 6 - 20 mg/dL 9  Creatinine 062(B - 7.62 mg/dL 8.31  Sodium 5.17 - 616 mmol/L 136  Potassium 3.5 - 5.1 mmol/L 3.4(L)  Chloride 98 - 111 mmol/L 109  CO2 22 - 32 mmol/L 19(L)  Calcium 8.9 - 10.3 mg/dL 073)    Discharge instruction: per After Visit Summary.  Medications:  Allergies as of 11/16/2021   No Known Allergies      Medication List     TAKE these medications    ibuprofen 600 MG tablet Commonly known as: ADVIL Take 1 tablet (600 mg total) by mouth every 6 (six) hours.   prenatal multivitamin Tabs tablet Take 1 tablet by mouth daily at 12 noon.        Diet: routine diet  Activity: Advance as tolerated. Pelvic rest for 6 weeks.   Outpatient follow up:  Follow-up Information     11/18/2021, MD. Schedule an appointment as soon as possible for a visit in 6 week(s).   Specialty: Obstetrics and Gynecology  Why: For Post Partum Check Contact information: [address] [place] [address] [phone]                   Postpartum contraception:  Plans Mirena IUD (prior consideration of tubal, but does not yet wish to do this permanent procedure) Rhogam Given postpartum: no Rubella vaccine given postpartum: no Varicella vaccine given postpartum: yes TDaP given antepartum or postpartum: declined  Newborn Data: Live born child  Birth Weight:   APGAR: ,   Newborn Delivery   Birth date/time: 11/15/2021 13:53:00 Delivery type: Vaginal, Spontaneous       Baby Feeding: Breast  Disposition:home with mother  SIGNED:  Mirna Mires, CNM 11/16/2021 10:18 AM

## 2021-11-15 NOTE — Telephone Encounter (Signed)
Transition Care Management Follow-up Telephone Call Date of discharge and from where: 11/11/2021 from [facility] How have you been since you were released from the hospital? Pt has been admitted to delivery today.  Any questions or concerns? No

## 2021-11-15 NOTE — OB Triage Note (Signed)
Pt [facility] to [facility] with complaints of contractions. Pt is [redacted]w[redacted]d, G5P4. Pt states contractions started last night around 2130. Pt states contractions are around 7 minutes a part now. Pt states positive FM. Pt denies LOF. Pt states upon wiping yesterdays she noted some vaginal bleeding/spotting. Monitors applied and assessing.

## 2021-11-15 NOTE — H&P (Addendum)
OB History & Physical   History of Present Illness:  Chief Complaint: contractions   HPI:  Joann Clark is a 35 y.o. [id number] female at [redacted]w[redacted]d dated by LMP.  Her pregnancy has been complicated by advanced maternal age in multigravida, obesity .    She reports contractions since last night. They have gotten closer   She denies leakage of fluid.   She denies vaginal spotting with wiping last night.   She reports fetal movement.    Total weight gain for pregnancy: 19.1 kg   Obstetrical Problem List: Pregnancy Problems (from 03/24/21 to present)     Problem Noted Resolved   [redacted] weeks gestation of pregnancy 11/11/2021 by Tresea Mall, CNM No   Non-reactive NST (non-stress test) 11/11/2021 by Tresea Mall, CNM No   Obesity affecting pregnancy in third trimester 09/02/2021 by Nadara Mustard, MD No   Overview Signed 09/02/2021 10:17 AM by Nadara Mustard, MD    BMI >=35.0-39.9 [x ] u/s for dating [x ]  [x ] nutritional goals [x ] folic acid 1mg  [x ] bASA (>12 weeks) [ ]  consider nutrition consult [ ]  consider maternal EKG 1st trimester [x ] Growth u/s 29 [x ], 32 [ ] , 36 weeks [ ]  [x ] NST/AFI weekly 37+ weeks (37[] , 38[] , 39[] , 40[] ) [x ] IOL by 41 weeks (scheduled, prn [] )       Advanced maternal age in multigravida, unspecified trimester 05/25/2021 by , CNM No   Supervision of high risk pregnancy, antepartum 05/24/2021 by , CNM No   Overview Addendum 11/01/2021  6:18 PM by , CNM     Nursing Staff Provider  Office Location  [facility] Dating  Completed at MFM  Language   English Anatomy 07/26/2021  Scheduled with MFM  Flu Vaccine   Decline Genetic Screen  NIPS: MaternT- xx  TDaP vaccine   Decline Hgb A1C or  GTT Third trimester :   Covid    LAB RESULTS   Rhogam   NA Blood Type   B+  Feeding Plan  Breast Antibody  negative  Contraception Considering tubal- consent signed 07/22/21 Rubella  immune, Varicella non immune  Circumcision n/a  RPR   NR  Pediatrician   HBsAg   negative  Support Person parents HIV  NR  Prenatal Classes  Varicella Non immune    GBS  (For PCN allergy, check sensitivities) negative  BTL Consent done    VBAC Consent n/a Pap  05/2021    MJ use- UDS is positive for cannabinoids.      Non-reassuring electronic fetal monitoring tracing 11/11/2021 by 11/03/2021, MD 11/11/2021 by [place], MD        Maternal Medical History:   Past Medical History:  Diagnosis Date   Encounter for surveillance of injectable contraceptive 03/27/2018   Medical history non-contributory    Vaginal Pap smear with ASC-US     History reviewed. No pertinent surgical history.  No Known Allergies  Prior to Admission medications   Medication Sig Start Date End Date Taking? Authorizing Provider  Prenatal Vit-Fe Fumarate-FA (PRENATAL MULTIVITAMIN) TABS tablet Take 1 tablet by mouth daily at 12 noon. 07/08/21  Yes 11/13/2021, CNM    OB History  Gravida Para Term Preterm AB Living  5 4 4     4   SAB IAB Ectopic Multiple Live Births          3    # Outcome Date GA  Lbr Len/2nd Weight Sex Delivery Anes PTL Lv  5 Current           4 Term 02/23/16 [redacted]w[redacted]d  2722 g F Vag-Spont     3 Term 03/12/15   3629 g F Vag-Spont   LIV  2 Term 08/24/10   2722 g F Vag-Spont   LIV  1 Term 07/20/04   2722 g F Vag-Spont  N LIV    Prenatal care site: [facility]  Social History: She  reports that she has quit smoking. She has never used smokeless tobacco. She reports current alcohol use. She reports that she does not use drugs.  Family History: family history includes Hypertension in her mother.    Review of Systems:  Review of Systems  Constitutional:  Negative for chills and fever.  HENT:  Negative for congestion, ear discharge, ear pain, hearing loss, sinus pain and sore throat.   Eyes:  Negative for blurred vision and double vision.  Respiratory:  Negative for cough, shortness of breath and wheezing.    Cardiovascular:  Negative for chest pain, palpitations and leg swelling.  Gastrointestinal:  Positive for abdominal pain. Negative for blood in stool, constipation, diarrhea, heartburn, melena, nausea and vomiting.  Genitourinary:  Negative for dysuria, flank pain, frequency, hematuria and urgency.  Musculoskeletal:  Negative for back pain, joint pain and myalgias.  Skin:  Negative for itching and rash.  Neurological:  Negative for dizziness, tingling, tremors, sensory change, speech change, focal weakness, seizures, loss of consciousness, weakness and headaches.  Endo/Heme/Allergies:  Negative for environmental allergies. Does not bruise/bleed easily.  Psychiatric/Behavioral:  Negative for depression, hallucinations, memory loss, substance abuse and suicidal ideas. The patient is not nervous/anxious and does not have insomnia.     Physical Exam:  BP 124/72 (BP Location: Left Arm)    Pulse 73    Temp 98.2 F (36.8 C) (Oral)    Resp 16    LMP 02/16/2021   Constitutional: Well nourished, well developed female in no acute distress.  HEENT: normal Skin: Warm and dry.  Cardiovascular: Regular rate and rhythm.   Extremity: no edema  Respiratory: Clear to auscultation bilateral. Normal respiratory effort Abdomen: FHT present Back: no CVAT Neuro: DTRs 2+, Cranial nerves grossly intact Psych: Alert and Oriented x3. No memory deficits. Normal mood and affect.    Pelvic exam: (female chaperone present) is not limited by body habitus EGBUS: within normal limits Vagina: within normal limits and with normal mucosa  Cervix: 3.5/80/-2   Baseline FHR: 140 beats/min   Variability: minimal to moderate   Accelerations: present   Decelerations: present/late Contractions: present frequency: 2-6 Overall assessment: Category II tracing   Lab Results  Component Value Date   [place] Not Detected 04/27/2020  Current covid screen pending  Assessment:  Joann Clark is a 34 y.o. [id number] female at  [redacted]w[redacted]d with contractions, category II fetal tracing.   Plan:  Admit to Labor & Delivery  CBC, T&S, Clrs, IVF GBS negative.   Fetal well-being: Category II Ambulate in room, expectant management for vaginal delivery    Rod Can, [facility] 11/15/2021 7:35 AM

## 2021-11-15 NOTE — Progress Notes (Signed)
°  Labor Progress Note   35 y.o. [id number] @ [redacted]w[redacted]d , admitted for  Pregnancy, Labor Management.   Subjective:  Mild pain w ctxs  Objective:  BP 128/74 (BP Location: Left Arm)    Pulse 68    Temp 97.9 F (36.6 C) (Oral)    Resp 16    Ht 5\' 5"  (1.651 m)    Wt 112 kg    LMP 02/16/2021    BMI 41.09 kg/m  Abd: gravid, ND, FHT present, mild tenderness on exam Extr: trace to 1+ bilateral pedal edema SVE: CERVIX: 5 cm dilated, 80 effaced, -1 station, presenting part Vtx, AROM CLEAR (scant amount)  EFM: FHR: 140 bpm, variability: moderate,  accelerations:  Present,  decelerations:  Absent Toco: Frequency: Every 3-4 minutes Labs: I have reviewed the patient's lab results.   Assessment & Plan:  02/18/2021 @ [redacted]w[redacted]d, admitted for  Pregnancy and Labor/Delivery Management  1. Pain management: IV sedation. 2. FWB: FHT category 1.  3. ID: GBS negative 4. Labor management: AROM now, continue to monitor for cervical change  All discussed with patient, see orders  [redacted]w[redacted]d, MD, Annamarie Major Ob/Gyn, [facility] 11/15/2021  9:48 AM

## 2021-11-16 ENCOUNTER — Encounter: Payer: Medicaid Other | Admitting: Licensed Practical Nurse

## 2021-11-16 LAB — RPR: RPR Ser Ql: NONREACTIVE

## 2021-11-16 LAB — CBC
HCT: 33.6 % — ABNORMAL LOW (ref 36.0–46.0)
Hemoglobin: 11.2 g/dL — ABNORMAL LOW (ref 12.0–15.0)
MCH: 30.2 pg (ref 26.0–34.0)
MCHC: 33.3 g/dL (ref 30.0–36.0)
MCV: 90.6 fL (ref 80.0–100.0)
Platelets: 287 10*3/uL (ref 150–400)
RBC: 3.71 MIL/uL — ABNORMAL LOW (ref 3.87–5.11)
RDW: 14.6 % (ref 11.5–15.5)
WBC: 10.8 10*3/uL — ABNORMAL HIGH (ref 4.0–10.5)
nRBC: 0 % (ref 0.0–0.2)

## 2021-11-16 MED ORDER — IBUPROFEN 600 MG PO TABS: 600.0000 mg | ORAL_TABLET | Freq: Four times a day (QID) | ORAL | 0 refills | Status: AC

## 2021-11-16 NOTE — Lactation Note (Signed)
This note was copied from a baby's chart. Lactation Consultation Note  Patient Name: Joann Clark [id number] Date: 11/16/2021 Reason for consult: Follow-up assessment;Early term 37-38.6wks Age:35 hours  Initial lactation visit. Mom is P5, SVD 19 hours ago. Mom has previously breastfed 1 other child for 3 months, and initially stated she wanted to do more bottle feeding but has exclusively BF since delivery.  Baby active at the breast upon entry, mom stating she started at [phone]. Baby positioned well, turned in towards the breast, flanged lips, and rhythmic sucking pattern. Baby has had several voids and stools since delivery indicating appropriate transfer. LC praised mom for breastfeeding with this baby, encouraged continued on demand feedings to help establish and grow a plentiful milk supply and cautioned against early bottle introduction and the impact this can have on BF. Tips given for when to introduce bottle and how. Mom has no questions/concerns, desires discharge if possible after 24hr testing.  Maternal Data Does the patient have breastfeeding experience prior to this delivery?: Yes How long did the patient breastfeed?: 3 months  Feeding Mother's Current Feeding Choice: Breast Milk  LATCH Score Latch: Grasps breast easily, tongue down, lips flanged, rhythmical sucking.  Audible Swallowing: Spontaneous and intermittent  Type of Nipple: Everted at rest and after stimulation  Comfort (Breast/Nipple): Soft / non-tender  Hold (Positioning): No assistance needed to correctly position infant at breast.  LATCH Score: 10   Lactation Tools Discussed/Used    Interventions Interventions: Education;Breast feeding basics reviewed  Discharge    Consult Status Consult Status: PRN    Danford Bad 11/16/2021, 10:27 AM

## 2021-11-16 NOTE — Progress Notes (Signed)
Nutrition Brief Note  Patient identified on the Malnutrition Screening Tool (MST) Report  Wt Readings from Last 15 Encounters:  11/15/21 112 kg  11/11/21 112 kg  11/11/21 112 kg  11/02/21 110.2 kg  10/28/21 108.9 kg  10/17/21 108.9 kg  09/30/21 106.6 kg  09/16/21 106.6 kg  09/15/21 107.7 kg  09/02/21 107.5 kg  08/19/21 106.6 kg  08/04/21 106.6 kg  07/22/21 105.2 kg  07/07/21 102.7 kg  06/21/21 99.3 kg   Joann Clark is a 35 y.o. [id number] female at [redacted]w[redacted]d dated by LMP.  Her pregnancy has been complicated by advanced maternal age in multigravida, obesity .     Baby girl delivered by pt on 11/15/21.   Per chart review, pre-gravid wt was 205#. Total weight gain for pregnancy is 19.1 kg, which is above recommended weight gain guidelines for pregnancy.   Current diet order is regular, patient is consuming approximately n/a% of meals at this time. Labs and medications reviewed.   No nutrition interventions warranted at this time. If nutrition issues arise, please consult RD.   Levada Schilling, RD, LDN, CDCES Registered Dietitian II Certified Diabetes Care and Education Specialist Please refer to [facility] for RD and/or RD on-call/weekend/after hours pager

## 2021-11-16 NOTE — Progress Notes (Signed)
Pt discharge instructions given, support person at bedside. All questions answered and pt verbalized understanding. Pt to be discharged home with family.

## 2021-11-17 ENCOUNTER — Telehealth: Payer: Self-pay

## 2021-11-17 NOTE — Telephone Encounter (Signed)
Transition Care Management Unsuccessful Follow-up Telephone Call  Date of discharge and from where:  11/16/2021 from [facility]  Attempts:  1st Attempt  Reason for unsuccessful TCM follow-up call:  Left voice message

## 2021-11-18 ENCOUNTER — Encounter: Payer: Medicaid Other | Admitting: Licensed Practical Nurse

## 2021-11-18 NOTE — Telephone Encounter (Signed)
Transition Care Management Unsuccessful Follow-up Telephone Call  Date of discharge and from where:  11/16/2021 from [facility]  Attempts:  2nd Attempt  Reason for unsuccessful TCM follow-up call:  Unable to leave message

## 2021-11-20 DIAGNOSIS — Z419 Encounter for procedure for purposes other than remedying health state, unspecified: Secondary | ICD-10-CM | POA: Diagnosis not present

## 2021-11-21 NOTE — Telephone Encounter (Signed)
Transition Care Management Unsuccessful Follow-up Telephone Call  Date of discharge and from where:  11/16/2021 from [facility]  Attempts:  3rd Attempt  Reason for unsuccessful TCM follow-up call:  Unable to reach patient

## 2021-11-23 ENCOUNTER — Telehealth: Payer: Self-pay | Admitting: Obstetrics & Gynecology

## 2021-11-23 NOTE — Telephone Encounter (Signed)
Pt is scheduled for Mirena placement with [facility] on 2/8.

## 2021-11-28 NOTE — Telephone Encounter (Signed)
Noted. Will order to arrive by apt date/time. 

## 2021-12-21 DIAGNOSIS — Z419 Encounter for procedure for purposes other than remedying health state, unspecified: Secondary | ICD-10-CM | POA: Diagnosis not present

## 2021-12-28 ENCOUNTER — Other Ambulatory Visit: Payer: Self-pay

## 2021-12-28 ENCOUNTER — Ambulatory Visit (INDEPENDENT_AMBULATORY_CARE_PROVIDER_SITE_OTHER): Payer: Medicaid Other | Admitting: Obstetrics & Gynecology

## 2021-12-28 ENCOUNTER — Encounter: Payer: Self-pay | Admitting: Obstetrics & Gynecology

## 2021-12-28 DIAGNOSIS — Z30430 Encounter for insertion of intrauterine contraceptive device: Secondary | ICD-10-CM | POA: Diagnosis not present

## 2021-12-28 NOTE — Progress Notes (Signed)
°  OBSTETRICS POSTPARTUM CLINIC PROGRESS NOTE  Subjective:     Joann Clark is a 36 y.o. [id number] female who presents for a postpartum visit. She is 6 weeks postpartum following a Term pregnancy, Single fetus, or Uncomplicated pregnancy and delivery by Vaginal, no problems at delivery.  I have fully reviewed the prenatal and intrapartum course. Anesthesia: epidural.  Postpartum course has been complicated by uncomplicated.  Baby is feeding by Bottle and Breast.  Bleeding: patient has not  resumed menses.  Bowel function is normal. Bladder function is normal.  Patient is not sexually active. Contraception method desired is IUD.  Postpartum depression screening: negative. Edinburgh 0.  The following portions of the patient's history were reviewed and updated as appropriate: allergies, current medications, past family history, past medical history, past social history, past surgical history, and problem list.  Review of Systems Pertinent items are noted in HPI.  Objective:    BP 112/62    Ht 5\' 5"  (1.651 m)    Wt 262 lb (118.8 kg)    Breastfeeding No    BMI 43.60 kg/m   General:  alert and no distress   Breasts:  inspection negative, no nipple discharge or bleeding, no masses or nodularity palpable  Lungs: clear to auscultation bilaterally  Heart:  regular rate and rhythm, S1, S2 normal, no murmur, click, rub or gallop  Abdomen: soft, non-tender; bowel sounds normal; no masses,  no organomegaly.    Vulva:  normal  Vagina: normal vagina, no discharge, exudate, lesion, or erythema  Cervix:  no cervical motion tenderness and no lesions  Corpus: normal size, contour, position, consistency, mobility, non-tender  Adnexa:  normal adnexa and no mass, fullness, tenderness  Rectal Exam: Not performed.          Assessment:  Post Partum Care visit 1. Postpartum care and examination  2. Encounter for insertion of intrauterine contraceptive device (IUD) See below   Plan:  See orders and  Patient Instructions Follow up in: 4 weeks or as needed.     IUD PROCEDURE NOTE:  Joann Clark is a 36 y.o. 31 here for IUD insertion. No GYN concerns.  Last pap smear was normal.  IUD Insertion Procedure Note Patient identified, informed consent performed, consent signed.   Discussed risks of irregular bleeding, cramping, infection, malpositioning or misplacement of the IUD outside the uterus which may require further procedure such as laparoscopy, risk of failure <1%. Time out was performed.  Urine pregnancy test negative.  A bimanual exam showed the uterus to be midposition.  Speculum placed in the vagina.  Cervix visualized.  Cleaned with Betadine x 2.  Grasped anteriorly with a single tooth tenaculum.  Uterus sounded to 9 cm.   Mirena IUD placed per manufacturer's recommendations.  Strings trimmed to 3 cm. Tenaculum was removed, good hemostasis noted.  Patient tolerated procedure well.   Patient was given post-procedure instructions.  She was advised to have backup contraception for one week.  Patient was also asked to check IUD strings periodically and follow up in 4 weeks for IUD check.  [id number], MD, Annamarie Major Ob/Gyn, [facility] 12/28/2021  11:20 AM

## 2021-12-28 NOTE — Patient Instructions (Signed)
Intrauterine Device Insertion, Care After This sheet gives you information about how to care for yourself after your procedure. Your health care provider may also give you more specific instructions. If you have problems or questions, contact your health care provider. What can I expect after the procedure? After the procedure, it is common to have: Cramps and pain in the abdomen. Bleeding. It may be light or heavy. This may last for a few days. Lower back pain. Dizziness. Headaches. Nausea. Follow these instructions at home:  Before resuming sexual activity, check to make sure that you can feel the IUD string or strings. You should be able to feel the end of the string below the opening of your cervix. If your IUD string is in place, you may resume sexual activity. If you had a hormonal IUD inserted more than 7 days after your most recent period started, you will need to use a backup method of birth control for 7 days after IUD insertion. Ask your health care provider whether this applies to you. Continue to check that the IUD is still in place by feeling for the strings after every menstrual period, or once a month. An IUD will not protect you from sexually transmitted infections (STIs). Use methods to prevent the exchange of body fluids between partners (barrier protection) every time you have sex. Barrier protection can be used during oral, vaginal, or anal sex. Commonly used barrier methods include: Female condom. Female condom. Dental dam. Take over-the-counter and prescription medicines only as told by your health care provider. Keep all follow-up visits as told by your health care provider. This is important. Contact a health care provider if: You feel light-headed or weak. You have any of the following problems with your IUD string or strings: The string bothers or hurts you or your sexual partner. You cannot feel the string. The string has gotten longer. You can feel the IUD in  your vagina. You think you may be pregnant, or you miss your menstrual period. You think you may have a sexually transmitted infection (STI). Get help right away if: You have flu-like symptoms, such as tiredness (fatigue) and muscle aches. You have a fever and chills. You have bleeding that is heavier or lasts longer than a normal menstrual cycle. You have abnormal or bad-smelling discharge from your vagina. You develop abdominal pain that is new, is getting worse, or is not in the same area of earlier cramping and pain. You have pain during sexual activity. Summary After the procedure, it is common to have cramps and pain in the abdomen. It is also common to have light bleeding or heavier bleeding that is like your menstrual period. Continue to check that the IUD is still in place by feeling for the strings after every menstrual period, or once a month. Keep all follow-up visits as told by your health care provider. This is important. Contact your health care provider if you have problems with your IUD strings, such as the string getting longer or bothering you or your sexual partner. This information is not intended to replace advice given to you by your health care provider. Make sure you discuss any questions you have with your health care provider. Document Revised: 10/28/2019 Document Reviewed: 10/28/2019 Elsevier Patient Education  2022 [employer]  

## 2022-01-18 ENCOUNTER — Other Ambulatory Visit: Payer: Self-pay

## 2022-01-18 DIAGNOSIS — Z419 Encounter for procedure for purposes other than remedying health state, unspecified: Secondary | ICD-10-CM | POA: Diagnosis not present

## 2022-01-27 ENCOUNTER — Ambulatory Visit: Payer: Medicaid Other | Admitting: Obstetrics & Gynecology

## 2022-01-30 ENCOUNTER — Other Ambulatory Visit: Payer: Self-pay

## 2022-01-30 ENCOUNTER — Ambulatory Visit (INDEPENDENT_AMBULATORY_CARE_PROVIDER_SITE_OTHER): Payer: Medicaid Other | Admitting: Obstetrics & Gynecology

## 2022-01-30 ENCOUNTER — Encounter: Payer: Self-pay | Admitting: Obstetrics & Gynecology

## 2022-01-30 VITALS — BP 120/80 | Ht 65.0 in | Wt 235.0 lb

## 2022-01-30 DIAGNOSIS — N938 Other specified abnormal uterine and vaginal bleeding: Secondary | ICD-10-CM | POA: Diagnosis not present

## 2022-01-30 DIAGNOSIS — Z975 Presence of (intrauterine) contraceptive device: Secondary | ICD-10-CM | POA: Diagnosis not present

## 2022-01-30 DIAGNOSIS — Z30431 Encounter for routine checking of intrauterine contraceptive device: Secondary | ICD-10-CM

## 2022-01-30 NOTE — Progress Notes (Signed)
?  History of Present Illness:  Joann Clark is a 36 y.o. that had a Mirena IUD placed approximately 4 weeks ago. Since that time, she states that she has had no pain but has had almost daily bleeding (intermiitent in amounts) ? ?PMHx: ?She  has a past medical history of Encounter for surveillance of injectable contraceptive (03/27/2018), Medical history non-contributory, and Vaginal Pap smear with ASC-US. Also,  has no past surgical history on file., family history includes Hypertension in her mother.,  reports that she has quit smoking. She has never used smokeless tobacco. She reports current alcohol use. She reports that she does not use drugs. ?Current Meds  ?Medication Sig  ? levonorgestrel (MIRENA) 20 MCG/DAY IUD 1 each by Intrauterine route once.  ?.  Also, has No Known Allergies.. ? ?Review of Systems  ?Constitutional:  Negative for chills, fever and malaise/fatigue.  ?HENT:  Negative for congestion, sinus pain and sore throat.   ?Eyes:  Negative for blurred vision and pain.  ?Respiratory:  Negative for cough and wheezing.   ?Cardiovascular:  Negative for chest pain and leg swelling.  ?Gastrointestinal:  Negative for abdominal pain, constipation, diarrhea, heartburn, nausea and vomiting.  ?Genitourinary:  Negative for dysuria, frequency, hematuria and urgency.  ?Musculoskeletal:  Negative for back pain, joint pain, myalgias and neck pain.  ?Skin:  Negative for itching and rash.  ?Neurological:  Negative for dizziness, tremors and weakness.  ?Endo/Heme/Allergies:  Does not bruise/bleed easily.  ?Psychiatric/Behavioral:  Negative for depression. The patient is not nervous/anxious and does not have insomnia.   ? ?Physical Exam:  ?BP 120/80   Ht 5\' 5"  (1.651 m)   Wt 235 lb (106.6 kg)   Breastfeeding No   BMI 39.11 kg/m?  Body mass index is 39.11 kg/m? ?Constitutional: Well nourished, well developed female in no acute distress.  ?Abdomen: diffusely non tender to palpation, non distended, and no masses,  hernias ?Neuro: Grossly intact ?Psych:  Normal mood and affect.   ? ?Pelvic exam:  ?Two IUD strings present seen coming from the cervical os. ?EGBUS, vaginal vault and cervix: within normal limits ? ?Assessment: IUD strings present in proper location; pt doing well although some prolonged period bleeding ? ?Plan: ?She was told to continue to use barrier contraception, in order to prevent any STIs, and to take a home pregnancy test or call [facility] if she ever thinks she may be pregnant, and that her IUD expires in 8 years. ?Will monitor bleeding; may need overlap or change in therapy ?She was amenable to this plan and we will see her back in 1 year/PRN. ? ?A total of 22 minutes were spent face-to-face with the patient as well as preparation, review, communication, and documentation during this encounter.  ? ?[place], MD, FACOG ?[facility], [facility] ?01/30/2022  10:40 AM ? ?

## 2022-02-13 NOTE — Telephone Encounter (Signed)
Mirena rcvd/charged 12/28/21 ?

## 2022-02-18 DIAGNOSIS — Z419 Encounter for procedure for purposes other than remedying health state, unspecified: Secondary | ICD-10-CM | POA: Diagnosis not present

## 2022-03-20 DIAGNOSIS — Z419 Encounter for procedure for purposes other than remedying health state, unspecified: Secondary | ICD-10-CM | POA: Diagnosis not present

## 2022-03-29 DIAGNOSIS — H5213 Myopia, bilateral: Secondary | ICD-10-CM | POA: Diagnosis not present

## 2022-04-20 DIAGNOSIS — Z419 Encounter for procedure for purposes other than remedying health state, unspecified: Secondary | ICD-10-CM | POA: Diagnosis not present

## 2022-05-20 DIAGNOSIS — Z419 Encounter for procedure for purposes other than remedying health state, unspecified: Secondary | ICD-10-CM | POA: Diagnosis not present

## 2022-06-20 DIAGNOSIS — Z419 Encounter for procedure for purposes other than remedying health state, unspecified: Secondary | ICD-10-CM | POA: Diagnosis not present

## 2022-07-21 DIAGNOSIS — Z419 Encounter for procedure for purposes other than remedying health state, unspecified: Secondary | ICD-10-CM | POA: Diagnosis not present

## 2022-08-20 DIAGNOSIS — Z419 Encounter for procedure for purposes other than remedying health state, unspecified: Secondary | ICD-10-CM | POA: Diagnosis not present

## 2022-09-20 DIAGNOSIS — Z419 Encounter for procedure for purposes other than remedying health state, unspecified: Secondary | ICD-10-CM | POA: Diagnosis not present

## 2022-09-28 DIAGNOSIS — Z1389 Encounter for screening for other disorder: Secondary | ICD-10-CM | POA: Diagnosis not present

## 2022-09-28 DIAGNOSIS — D229 Melanocytic nevi, unspecified: Secondary | ICD-10-CM | POA: Insufficient documentation

## 2022-10-20 DIAGNOSIS — Z419 Encounter for procedure for purposes other than remedying health state, unspecified: Secondary | ICD-10-CM | POA: Diagnosis not present

## 2022-11-02 DIAGNOSIS — D229 Melanocytic nevi, unspecified: Secondary | ICD-10-CM | POA: Diagnosis not present

## 2022-11-20 DIAGNOSIS — Z419 Encounter for procedure for purposes other than remedying health state, unspecified: Secondary | ICD-10-CM | POA: Diagnosis not present

## 2022-12-21 DIAGNOSIS — Z419 Encounter for procedure for purposes other than remedying health state, unspecified: Secondary | ICD-10-CM | POA: Diagnosis not present

## 2023-01-09 ENCOUNTER — Ambulatory Visit: Payer: Medicaid Other | Admitting: Advanced Practice Midwife

## 2023-01-19 DIAGNOSIS — Z419 Encounter for procedure for purposes other than remedying health state, unspecified: Secondary | ICD-10-CM | POA: Diagnosis not present

## 2023-02-06 ENCOUNTER — Ambulatory Visit: Payer: Medicaid Other | Admitting: Advanced Practice Midwife

## 2023-02-19 DIAGNOSIS — Z419 Encounter for procedure for purposes other than remedying health state, unspecified: Secondary | ICD-10-CM | POA: Diagnosis not present

## 2023-03-15 ENCOUNTER — Telehealth: Payer: Self-pay

## 2023-03-15 NOTE — Telephone Encounter (Signed)
..   Medicaid Managed Care   Unsuccessful Outreach Note  03/15/2023 Name: Joann Clark MRN: [id number] DOB: Apr 20, 1986  Referred by: [facility] Reason for referral : Appointment   A second unsuccessful telephone outreach was attempted today. The patient was referred to the case management team for assistance with care management and care coordination.   Follow Up Plan: A HIPAA compliant phone message was left for the patient providing contact information and requesting a return call.  The care management team will reach out to the patient again over the next 7-14 days.   Weston Settle Care Guide  [facility]aged  [facility]  [phone]

## 2023-03-21 DIAGNOSIS — Z419 Encounter for procedure for purposes other than remedying health state, unspecified: Secondary | ICD-10-CM | POA: Diagnosis not present

## 2023-04-04 ENCOUNTER — Other Ambulatory Visit (HOSPITAL_COMMUNITY)
Admission: RE | Admit: 2023-04-04 | Discharge: 2023-04-04 | Disposition: A | Discharge status: Home / Self Care | Payer: Medicaid Other | Source: Ambulatory Visit | Attending: Advanced Practice Midwife | Admitting: Advanced Practice Midwife

## 2023-04-04 ENCOUNTER — Encounter: Payer: Self-pay | Admitting: Advanced Practice Midwife

## 2023-04-04 ENCOUNTER — Ambulatory Visit (INDEPENDENT_AMBULATORY_CARE_PROVIDER_SITE_OTHER): Payer: Medicaid Other | Admitting: Advanced Practice Midwife

## 2023-04-04 VITALS — BP 111/85 | Ht 65.0 in | Wt 227.0 lb

## 2023-04-04 DIAGNOSIS — Z13220 Encounter for screening for lipoid disorders: Secondary | ICD-10-CM

## 2023-04-04 DIAGNOSIS — R5383 Other fatigue: Secondary | ICD-10-CM | POA: Diagnosis not present

## 2023-04-04 DIAGNOSIS — Z1159 Encounter for screening for other viral diseases: Secondary | ICD-10-CM

## 2023-04-04 DIAGNOSIS — Z01419 Encounter for gynecological examination (general) (routine) without abnormal findings: Secondary | ICD-10-CM | POA: Diagnosis not present

## 2023-04-04 DIAGNOSIS — Z1321 Encounter for screening for nutritional disorder: Secondary | ICD-10-CM | POA: Diagnosis not present

## 2023-04-04 DIAGNOSIS — Z131 Encounter for screening for diabetes mellitus: Secondary | ICD-10-CM

## 2023-04-04 DIAGNOSIS — N898 Other specified noninflammatory disorders of vagina: Secondary | ICD-10-CM | POA: Insufficient documentation

## 2023-04-04 DIAGNOSIS — Z113 Encounter for screening for infections with a predominantly sexual mode of transmission: Secondary | ICD-10-CM

## 2023-04-04 NOTE — Progress Notes (Signed)
[facility]   Gynecology Annual Exam   PCP: [facility], [place]  Chief Complaint:  Chief Complaint  Patient presents with   Annual Exam    Wants iud check, feels like maybe its has moved    History of Present Illness: Patient is a 37 y.o. [id number] presents for annual exam. The patient has complaint today of a feeling that her IUD is coming out when she pees. She denies frequency or urgency of urination. IUD inserted at 6 week postpartum visit 12/28/21.  LMP: Patient's last menstrual period was 03/29/2023. Average Interval: regular, 28 days Duration of flow: 7 days Heavy Menses: 2 heavy days Clots: no Intermenstrual Bleeding: no Postcoital Bleeding: no Dysmenorrhea: no  The patient is not currently sexually active. She currently uses IUD for contraception. She denies dyspareunia.  The patient does perform self breast exams.  There is no notable family history of breast or ovarian cancer in her family.  The patient wears seatbelts: yes.   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 She reports diet is heavy on carbs/fast foods mainly for convenience and admits out of the habit of healthy diet. She primarily drinks water and some soda/sweet tea. She admits adequate sleep. She enjoys coaching girls rec basketball.   The patient denies current symptoms of depression.    Review of Systems: Review of Systems  Constitutional:  Negative for chills and fever.  HENT:  Negative for congestion, ear discharge, ear pain, hearing loss, sinus pain and sore throat.   Eyes:  Negative for blurred vision and double vision.  Respiratory:  Negative for cough, shortness of breath and wheezing.   Cardiovascular:  Negative for chest pain, palpitations and leg swelling.  Gastrointestinal:  Negative for abdominal pain, blood in stool, constipation, diarrhea, heartburn, melena, nausea and vomiting.  Genitourinary:  Negative for dysuria, flank  pain, frequency, hematuria and urgency.       Positive for feels like IUD is coming out when peeing  Musculoskeletal:  Negative for back pain, joint pain and myalgias.  Skin:  Negative for itching and rash.  Neurological:  Negative for dizziness, tingling, tremors, sensory change, speech change, focal weakness, seizures, loss of consciousness, weakness and headaches.  Endo/Heme/Allergies:  Negative for environmental allergies. Does not bruise/bleed easily.  Psychiatric/Behavioral:  Negative for depression, hallucinations, memory loss, substance abuse and suicidal ideas. The patient is not nervous/anxious and does not have insomnia.     Past Medical History:  Patient Active Problem List   Diagnosis Date Noted   Postpartum care following vaginal delivery 11/16/2021   Vitamin D deficiency 03/27/2018    Past Surgical History:  History reviewed. No pertinent surgical history.  Gynecologic History:  Patient's last menstrual period was 03/29/2023. Contraception: IUD Last Pap: 2022 Results were: no abnormalities   Obstetric History: [id number]  Family History:  Family History  Problem Relation Age of Onset   Hypertension Mother     Social History:  Social History   Socioeconomic History   Marital status: Single    Spouse name: Not on file   Number of children: Not on file   Years of education: Not on file   Highest education level: Not on file  Occupational History   Not on file  Tobacco Use   Smoking status: Former   Smokeless tobacco: Never  Vaping Use   Vaping Use: Never used  Substance and Sexual Activity   Alcohol use:  Yes   Drug use: No   Sexual activity: Yes    Birth control/protection: I.U.D.    Comment: undecided  Other Topics Concern   Not on file  Social History Narrative   Not on file   Social Determinants of Health   Financial Resource Strain: Not on file  Food Insecurity: Not on file  Transportation Needs: Not on file  Physical Activity: Not on file   Stress: Not on file  Social Connections: Not on file  Intimate Partner Violence: Not on file    Allergies:  No Known Allergies  Medications: Prior to Admission medications   Medication Sig Start Date End Date Taking? Authorizing Provider  ibuprofen (ADVIL) 600 MG tablet Take 1 tablet (600 mg total) by mouth every 6 (six) hours. 11/16/21  Yes Mirna Mires, CNM  levonorgestrel (MIRENA) 20 MCG/DAY IUD 1 each by Intrauterine route once.   Yes [provider]  Prenatal Vit-Fe Fumarate-FA (PRENATAL MULTIVITAMIN) TABS tablet Take 1 tablet by mouth daily at 12 noon. 07/08/21  Yes Mirna Mires, CNM    Physical Exam Vitals: Blood pressure 111/85, height 5\' 5"  (1.651 m), weight 227 lb (103 kg), last menstrual period 03/29/2023, not currently breastfeeding.  General: NAD HEENT: normocephalic, anicteric Thyroid: no enlargement, no palpable nodules Pulmonary: No increased work of breathing, CTAB Cardiovascular: RRR, distal pulses 2+ Breast: Breast symmetrical, no tenderness, no palpable nodules or masses, no skin or nipple retraction present, no nipple discharge.  No axillary or supraclavicular lymphadenopathy. Abdomen: NABS, soft, non-tender, non-distended.  Umbilicus without lesions.  No hepatomegaly, splenomegaly or masses palpable. No evidence of hernia  Genitourinary:  External: Normal external female genitalia.  Normal urethral meatus, normal Bartholin's and Skene's glands.    Vagina: Normal vaginal mucosa, no evidence of prolapse.    Cervix: Grossly normal in appearance, no bleeding, IUD strings appear to be 3 cm  Uterus: Non-enlarged, mobile, normal contour.  No CMT  Adnexa: ovaries non-enlarged, no adnexal masses  Rectal: deferred  Lymphatic: no evidence of inguinal lymphadenopathy Extremities: no edema, erythema, or tenderness Neurologic: Grossly intact Psychiatric: mood appropriate, affect full   Assessment: 37 y.o. [id number] routine annual exam  Plan: Problem  List Items Addressed This Visit   None Visit Diagnoses     Well woman exam with routine gynecological exam    -  Primary   Relevant Orders   Lipid Panel With LDL/HDL Ratio   CBC with Differential/Platelet   Hgb A1c w/o eAG   TSH   VITAMIN D 25 Hydroxy (Vit-D Deficiency, Fractures)   Hepatitis B surface antibody,qualitative   Hepatitis C antibody   HIV Antibody (routine testing w rflx)   RPR Qual   Cervicovaginal ancillary only   Screen for sexually transmitted diseases       Relevant Orders   Hepatitis B surface antibody,qualitative   Hepatitis C antibody   HIV Antibody (routine testing w rflx)   RPR Qual   Cervicovaginal ancillary only   Need for hepatitis B screening test       Relevant Orders   Hepatitis B surface antibody,qualitative   Need for hepatitis C screening test       Relevant Orders   Hepatitis C antibody   Lipid screening       Relevant Orders   Lipid Panel With LDL/HDL Ratio   Encounter for vitamin deficiency screening       Relevant Orders   VITAMIN D 25 Hydroxy (Vit-D Deficiency, Fractures)   Vagina itching  Relevant Orders   Cervicovaginal ancillary only   Other fatigue       Relevant Orders   CBC with Differential/Platelet   Screening for diabetes mellitus       Relevant Orders   Hgb A1c w/o eAG       1) STI screening  was offered and accepted  2)  ASCCP guidelines and rationale discussed.  Patient opts for every 3 years screening interval. PAP due next year  3) Contraception - the patient is currently using  IUD.  She is happy with her current form of contraception and plans to continue. Follow up as needed for imaging if symptoms worsen  4) Routine healthcare maintenance including cholesterol, diabetes screening discussed Ordered today  5) Return in about 1 year (around 04/03/2024) for annual established gyn.   Tresea Mall, CNM [facility] 04/04/2023 12:58 PM

## 2023-04-05 LAB — CBC WITH DIFFERENTIAL/PLATELET
Basophils Absolute: 0 10*3/uL (ref 0.0–0.2)
Basos: 0 %
EOS (ABSOLUTE): 0 10*3/uL (ref 0.0–0.4)
Eos: 1 %
Hematocrit: 38.6 % (ref 34.0–46.6)
Hemoglobin: 13.5 g/dL (ref 11.1–15.9)
Immature Grans (Abs): 0 10*3/uL (ref 0.0–0.1)
Immature Granulocytes: 0 %
Lymphocytes Absolute: 2.1 10*3/uL (ref 0.7–3.1)
Lymphs: 42 %
MCH: 31.8 pg (ref 26.6–33.0)
MCHC: 35 g/dL (ref 31.5–35.7)
MCV: 91 fL (ref 79–97)
Monocytes Absolute: 0.3 10*3/uL (ref 0.1–0.9)
Monocytes: 6 %
Neutrophils Absolute: 2.6 10*3/uL (ref 1.4–7.0)
Neutrophils: 51 %
Platelets: 273 10*3/uL (ref 150–450)
RBC: 4.25 x10E6/uL (ref 3.77–5.28)
RDW: 13 % (ref 11.7–15.4)
WBC: 5.1 10*3/uL (ref 3.4–10.8)

## 2023-04-05 LAB — CERVICOVAGINAL ANCILLARY ONLY
Bacterial Vaginitis (gardnerella): NEGATIVE
Candida Glabrata: NEGATIVE
Candida Vaginitis: NEGATIVE
Chlamydia: POSITIVE — AB
Comment: NEGATIVE
Comment: NEGATIVE
Comment: NEGATIVE
Comment: NEGATIVE
Comment: NEGATIVE
Comment: NORMAL
Neisseria Gonorrhea: NEGATIVE
Trichomonas: NEGATIVE

## 2023-04-05 LAB — LIPID PANEL WITH LDL/HDL RATIO
Cholesterol, Total: 184 mg/dL (ref 100–199)
HDL: 41 mg/dL (ref >39)
LDL Chol Calc (NIH): 130 mg/dL — ABNORMAL HIGH (ref 0–99)
LDL/HDL Ratio: 3.2 ratio (ref 0.0–3.2)
Triglycerides: 72 mg/dL (ref 0–149)
VLDL Cholesterol Cal: 13 mg/dL (ref 5–40)

## 2023-04-05 LAB — HEPATITIS C ANTIBODY: Hep C Virus Ab: NONREACTIVE

## 2023-04-05 LAB — TSH: TSH: 1.46 u[IU]/mL (ref 0.450–4.500)

## 2023-04-05 LAB — HEPATITIS B SURFACE ANTIBODY,QUALITATIVE: Hep B Surface Ab, Qual: REACTIVE

## 2023-04-05 LAB — VITAMIN D 25 HYDROXY (VIT D DEFICIENCY, FRACTURES): Vit D, 25-Hydroxy: 11.9 ng/mL — ABNORMAL LOW (ref 30.0–100.0)

## 2023-04-05 LAB — HGB A1C W/O EAG: Hgb A1c MFr Bld: 5.5 % (ref 4.8–5.6)

## 2023-04-05 LAB — HIV ANTIBODY (ROUTINE TESTING W REFLEX): HIV Screen 4th Generation wRfx: NONREACTIVE

## 2023-04-05 LAB — SYPHILIS: RPR WITH REFLEX TO RPR TITER: RPR Ser Ql: NONREACTIVE

## 2023-04-15 ENCOUNTER — Other Ambulatory Visit: Payer: Self-pay | Admitting: Advanced Practice Midwife

## 2023-04-15 DIAGNOSIS — A5609 Other chlamydial infection of lower genitourinary tract: Secondary | ICD-10-CM

## 2023-04-15 MED ORDER — AZITHROMYCIN 500 MG PO TABS
1000.0000 mg | ORAL_TABLET | Freq: Once | ORAL | 1 refills | Status: AC
Start: 2023-04-15 — End: 2023-04-15

## 2023-04-15 NOTE — Progress Notes (Signed)
Message to patient regarding CT infection and Rx sent

## 2023-04-21 DIAGNOSIS — Z419 Encounter for procedure for purposes other than remedying health state, unspecified: Secondary | ICD-10-CM | POA: Diagnosis not present

## 2023-05-21 DIAGNOSIS — Z419 Encounter for procedure for purposes other than remedying health state, unspecified: Secondary | ICD-10-CM | POA: Diagnosis not present

## 2023-06-21 DIAGNOSIS — Z419 Encounter for procedure for purposes other than remedying health state, unspecified: Secondary | ICD-10-CM | POA: Diagnosis not present

## 2023-07-22 DIAGNOSIS — Z419 Encounter for procedure for purposes other than remedying health state, unspecified: Secondary | ICD-10-CM | POA: Diagnosis not present

## 2023-08-21 DIAGNOSIS — Z419 Encounter for procedure for purposes other than remedying health state, unspecified: Secondary | ICD-10-CM | POA: Diagnosis not present

## 2023-09-21 DIAGNOSIS — Z419 Encounter for procedure for purposes other than remedying health state, unspecified: Secondary | ICD-10-CM | POA: Diagnosis not present

## 2023-10-21 DIAGNOSIS — Z419 Encounter for procedure for purposes other than remedying health state, unspecified: Secondary | ICD-10-CM | POA: Diagnosis not present

## 2023-11-21 DIAGNOSIS — Z419 Encounter for procedure for purposes other than remedying health state, unspecified: Secondary | ICD-10-CM | POA: Diagnosis not present

## 2023-12-22 DIAGNOSIS — Z419 Encounter for procedure for purposes other than remedying health state, unspecified: Secondary | ICD-10-CM | POA: Diagnosis not present

## 2024-01-19 DIAGNOSIS — Z419 Encounter for procedure for purposes other than remedying health state, unspecified: Secondary | ICD-10-CM | POA: Diagnosis not present

## 2024-03-01 DIAGNOSIS — Z419 Encounter for procedure for purposes other than remedying health state, unspecified: Secondary | ICD-10-CM | POA: Diagnosis not present

## 2024-03-31 DIAGNOSIS — Z419 Encounter for procedure for purposes other than remedying health state, unspecified: Secondary | ICD-10-CM | POA: Diagnosis not present

## 2024-04-29 DIAGNOSIS — H5213 Myopia, bilateral: Secondary | ICD-10-CM | POA: Diagnosis not present

## 2024-05-01 DIAGNOSIS — Z419 Encounter for procedure for purposes other than remedying health state, unspecified: Secondary | ICD-10-CM | POA: Diagnosis not present

## 2024-05-31 DIAGNOSIS — Z419 Encounter for procedure for purposes other than remedying health state, unspecified: Secondary | ICD-10-CM | POA: Diagnosis not present

## 2024-07-01 DIAGNOSIS — Z419 Encounter for procedure for purposes other than remedying health state, unspecified: Secondary | ICD-10-CM | POA: Diagnosis not present

## 2024-07-29 ENCOUNTER — Encounter: Payer: Self-pay | Admitting: Advanced Practice Midwife

## 2024-07-29 ENCOUNTER — Other Ambulatory Visit (HOSPITAL_COMMUNITY)
Admission: RE | Admit: 2024-07-29 | Discharge: 2024-07-29 | Disposition: A | Discharge status: Home / Self Care | Source: Ambulatory Visit | Attending: Advanced Practice Midwife | Admitting: Advanced Practice Midwife

## 2024-07-29 ENCOUNTER — Ambulatory Visit: Admitting: Advanced Practice Midwife

## 2024-07-29 VITALS — BP 119/78 | HR 92 | Ht 65.0 in | Wt 230.9 lb

## 2024-07-29 DIAGNOSIS — Z30432 Encounter for removal of intrauterine contraceptive device: Secondary | ICD-10-CM | POA: Diagnosis not present

## 2024-07-29 DIAGNOSIS — Z113 Encounter for screening for infections with a predominantly sexual mode of transmission: Secondary | ICD-10-CM

## 2024-07-29 NOTE — Patient Instructions (Signed)

## 2024-07-29 NOTE — Progress Notes (Signed)
    GYNECOLOGY OFFICE PROCEDURE NOTE  Joann Clark is a 38 y.o. [id number] here for IUD removal. The patient currently has a Mirena  IUD placed on 12/28/21. She has not been sexually active for the past 8 months and prefers at this time to have hormone free interval. Monthly bleeding with Mirena  was tolerable. No GYN concerns.    Last pap smear was on 05/24/21 and was normal. Also reviewed routine recommended screenings and healthy lifestyle for general well being. STD screening requested.   Review of Systems  Constitutional:  Negative for chills and fever.  HENT:  Negative for congestion, ear discharge, ear pain, hearing loss, sinus pain and sore throat.   Eyes:  Negative for blurred vision and double vision.  Respiratory:  Negative for cough, shortness of breath and wheezing.   Cardiovascular:  Negative for chest pain, palpitations and leg swelling.  Gastrointestinal:  Negative for abdominal pain, blood in stool, constipation, diarrhea, heartburn, melena, nausea and vomiting.  Genitourinary:  Negative for dysuria, flank pain, frequency, hematuria and urgency.  Musculoskeletal:  Negative for back pain, joint pain and myalgias.  Skin:  Negative for itching and rash.  Neurological:  Negative for dizziness, tingling, tremors, sensory change, speech change, focal weakness, seizures, loss of consciousness, weakness and headaches.  Endo/Heme/Allergies:  Negative for environmental allergies. Does not bruise/bleed easily.  Psychiatric/Behavioral:  Negative for depression, hallucinations, memory loss, substance abuse and suicidal ideas. The patient is not nervous/anxious and does not have insomnia.    Vital Signs: BP 119/78   Pulse 92   Ht 5' 5 (1.651 m)   Wt 230 lb 14.4 oz (104.7 kg)   LMP 07/14/2024   BMI 38.42 kg/m  Constitutional: obese female in no acute distress.  HEENT: normal Skin: Warm and dry.  Cardiovascular: Regular rate and rhythm.   Respiratory:  Normal respiratory effort Psych:  Alert and Oriented x3. No memory deficits. Normal mood and affect.    Pelvic exam:  is not limited by body habitus EGBUS: within normal limits Vagina: within normal limits and with normal mucosa  Cervix: grossly normal appearance   IUD Removal   Patient identified, informed consent performed, consent signed.  Time out was performed. Speculum placed in the vagina. The strings of the IUD were grasped and pulled using ring forceps. The IUD was successfully removed in its entirety. Patient tolerated procedure well.   STD screening: aptima Follow up as needed  Return to clinic for annual exam in 1 year   Slater Rains, CNM [facility] 07/29/2024 4:38 PM

## 2024-07-31 LAB — CERVICOVAGINAL ANCILLARY ONLY
Chlamydia: NEGATIVE
Comment: NEGATIVE
Comment: NEGATIVE
Comment: NORMAL
Neisseria Gonorrhea: NEGATIVE
Trichomonas: NEGATIVE

## 2024-08-01 DIAGNOSIS — Z419 Encounter for procedure for purposes other than remedying health state, unspecified: Secondary | ICD-10-CM | POA: Diagnosis not present

## 2024-08-18 ENCOUNTER — Ambulatory Visit: Payer: Self-pay | Admitting: Advanced Practice Midwife

## 2024-08-31 DIAGNOSIS — Z419 Encounter for procedure for purposes other than remedying health state, unspecified: Secondary | ICD-10-CM | POA: Diagnosis not present

## 2024-10-01 DIAGNOSIS — Z419 Encounter for procedure for purposes other than remedying health state, unspecified: Secondary | ICD-10-CM | POA: Diagnosis not present

## 2024-10-06 DIAGNOSIS — F331 Major depressive disorder, recurrent, moderate: Secondary | ICD-10-CM | POA: Diagnosis not present

## 2024-10-06 DIAGNOSIS — Z1389 Encounter for screening for other disorder: Secondary | ICD-10-CM | POA: Diagnosis not present

## 2024-10-06 DIAGNOSIS — K219 Gastro-esophageal reflux disease without esophagitis: Secondary | ICD-10-CM | POA: Diagnosis not present

## 2024-10-06 DIAGNOSIS — Z3200 Encounter for pregnancy test, result unknown: Secondary | ICD-10-CM | POA: Diagnosis not present

## 2024-10-06 DIAGNOSIS — Z1331 Encounter for screening for depression: Secondary | ICD-10-CM | POA: Diagnosis not present

## 2025-02-10 ENCOUNTER — Ambulatory Visit: Admitting: Advanced Practice Midwife
